# Patient Record
Sex: Male | Born: 1980 | Race: Black or African American | Hispanic: No | Marital: Married | Smoking: Former smoker
Health system: Southern US, Community
[De-identification: ages and names within clinical notes are randomized; demographics above are authoritative.]

## PROBLEM LIST (undated history)

## (undated) DIAGNOSIS — I10 Essential (primary) hypertension: Secondary | ICD-10-CM

## (undated) DIAGNOSIS — E119 Type 2 diabetes mellitus without complications: Secondary | ICD-10-CM

## (undated) HISTORY — DX: Type 2 diabetes mellitus without complications: E11.9

## (undated) HISTORY — PX: ANKLE SURGERY: SHX546

---

## 2002-09-07 ENCOUNTER — Emergency Department (HOSPITAL_COMMUNITY): Admission: EM | Admit: 2002-09-07 | Discharge: 2002-09-08 | Payer: Self-pay | Admitting: Emergency Medicine

## 2004-05-16 ENCOUNTER — Emergency Department (HOSPITAL_COMMUNITY): Admission: EM | Admit: 2004-05-16 | Discharge: 2004-05-16 | Payer: Self-pay | Admitting: Family Medicine

## 2004-10-31 ENCOUNTER — Emergency Department (HOSPITAL_COMMUNITY): Admission: EM | Admit: 2004-10-31 | Discharge: 2004-10-31 | Payer: Self-pay | Admitting: Family Medicine

## 2005-08-08 ENCOUNTER — Ambulatory Visit (HOSPITAL_COMMUNITY): Admission: RE | Admit: 2005-08-08 | Discharge: 2005-08-08 | Payer: Self-pay | Admitting: Family Medicine

## 2005-08-08 ENCOUNTER — Emergency Department (HOSPITAL_COMMUNITY): Admission: EM | Admit: 2005-08-08 | Discharge: 2005-08-08 | Payer: Self-pay | Admitting: Family Medicine

## 2005-08-10 ENCOUNTER — Encounter: Admission: RE | Admit: 2005-08-10 | Discharge: 2005-08-10 | Payer: Self-pay | Admitting: Orthopedic Surgery

## 2005-08-19 ENCOUNTER — Ambulatory Visit (HOSPITAL_BASED_OUTPATIENT_CLINIC_OR_DEPARTMENT_OTHER): Admission: RE | Admit: 2005-08-19 | Discharge: 2005-08-19 | Payer: Self-pay | Admitting: Orthopedic Surgery

## 2006-03-22 ENCOUNTER — Emergency Department (HOSPITAL_COMMUNITY): Admission: EM | Admit: 2006-03-22 | Discharge: 2006-03-22 | Payer: Self-pay | Admitting: Family Medicine

## 2009-05-22 ENCOUNTER — Emergency Department (HOSPITAL_COMMUNITY): Admission: EM | Admit: 2009-05-22 | Discharge: 2009-05-22 | Payer: Self-pay | Admitting: Emergency Medicine

## 2009-05-26 ENCOUNTER — Emergency Department (HOSPITAL_COMMUNITY): Admission: EM | Admit: 2009-05-26 | Discharge: 2009-05-26 | Payer: Self-pay | Admitting: Emergency Medicine

## 2010-02-17 ENCOUNTER — Emergency Department (HOSPITAL_COMMUNITY): Admission: EM | Admit: 2010-02-17 | Discharge: 2010-02-17 | Payer: Self-pay | Admitting: Family Medicine

## 2010-03-24 ENCOUNTER — Ambulatory Visit: Payer: Self-pay | Admitting: Nurse Practitioner

## 2010-03-24 DIAGNOSIS — I10 Essential (primary) hypertension: Secondary | ICD-10-CM

## 2010-03-24 DIAGNOSIS — F17200 Nicotine dependence, unspecified, uncomplicated: Secondary | ICD-10-CM

## 2010-03-24 LAB — CONVERTED CEMR LAB
AST: 14 units/L (ref 0–37)
Alkaline Phosphatase: 46 units/L (ref 39–117)
Basophils Absolute: 0 10*3/uL (ref 0.0–0.1)
Basophils Relative: 0 % (ref 0–1)
Eosinophils Relative: 4 % (ref 0–5)
Glucose, Bld: 93 mg/dL (ref 70–99)
Lymphocytes Relative: 46 % (ref 12–46)
MCHC: 33.5 g/dL (ref 30.0–36.0)
Monocytes Absolute: 0.3 10*3/uL (ref 0.1–1.0)
Neutro Abs: 2 10*3/uL (ref 1.7–7.7)
Platelets: 182 10*3/uL (ref 150–400)
RDW: 12.7 % (ref 11.5–15.5)
Rapid HIV Screen: NEGATIVE
Sodium: 139 meq/L (ref 135–145)
Total Bilirubin: 0.5 mg/dL (ref 0.3–1.2)
Total Protein: 6.9 g/dL (ref 6.0–8.3)

## 2010-03-25 ENCOUNTER — Encounter (INDEPENDENT_AMBULATORY_CARE_PROVIDER_SITE_OTHER): Payer: Self-pay | Admitting: Nurse Practitioner

## 2010-03-26 ENCOUNTER — Ambulatory Visit: Payer: Self-pay | Admitting: Nurse Practitioner

## 2010-06-08 NOTE — Assessment & Plan Note (Signed)
Summary: NEW - Establish care   Vital Signs:  Patient profile:   30 year old male Height:      69 inches Weight:      200.7 pounds BMI:     29.75 Temp:     98.7 degrees F oral Pulse rate:   76 / minute Pulse rhythm:   regular Resp:     18 per minute BP sitting:   104 / 82  (left arm) Cuff size:   regular  Vitals Entered By: Armenia Shannon (March 24, 2010 1:58 PM)  Nutrition Counseling: Patient's BMI is greater than 25 and therefore counseled on weight management options. CC: establish with primary and med refill...., Hypertension Management Is Patient Diabetic? No Pain Assessment Patient in pain? no       Does patient need assistance? Functional Status Self care Ambulation Normal   CC:  establish with primary and med refill.... and Hypertension Management.  History of Present Illness:  Pt into the office to establish care. Pt was previously seeing another PCP here in [place] but lost his insurance and was not able to afford to return to that office.  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 Pt is still searching to get a job in his fleld. His PPD is expired and would like to get today.  No acute problems today  Hypertension History:      He denies headache, chest pain, and palpitations.  He notes no problems with any antihypertensive medication side effects.  Pt has completed his supply of medication this morning.        Positive major cardiovascular risk factors include hypertension and current tobacco user.  Negative major cardiovascular risk factors include male age less than 66 years old.     Habits & Providers  Alcohol-Tobacco-Diet     Alcohol drinks/day: 0     Tobacco Status: current     Tobacco Counseling: to quit use of tobacco products     Cigarette Packs/Day: <0.25     Year Started: age 38  Exercise-Depression-Behavior     Does Patient Exercise: no     Drug Use: never  Current Medications  (verified): 1)  Metoprolol Tartrate 25 Mg Tabs (Metoprolol Tartrate) .... One Tab By Mouth Twice Daily  Allergies (verified): No Known Drug Allergies  Family History: mother - htn father - htn maternal aunt - breast cancer  Social History: 1 child single tobacco - cigarettes 3 per day Drug - ETOH - Smoking Status:  current Packs/Day:  <0.25 Drug Use:  never Does Patient Exercise:  no  Review of Systems General:  Denies fever. CV:  Denies chest pain or discomfort. Resp:  Denies cough. GI:  Denies abdominal pain, nausea, and vomiting.  Physical Exam  General:  alert.   Head:  normocephalic.   Eyes:  pupils round.   Lungs:  normal breath sounds.   Heart:  normal rate and regular rhythm.   Abdomen:  normal bowel sounds.   Msk:  up to the exam table Neurologic:  alert & oriented X3.   Skin:  color normal.   Psych:  Oriented X3.     Impression & Recommendations:  Problem # 1:  HYPERTENSION, BENIGN ESSENTIAL (ICD-401.1) BP is stable today continue current meds - will refill DASH diet reviewed His updated medication list for this problem includes:    Metoprolol Tartrate 25 Mg Tabs (Metoprolol tartrate) ..... One tablet by mouth two times a day for  blood pressure  Orders: T-Comprehensive Metabolic Panel [phone]) T-CBC w/Diff (26948-54627) T-TSH (03500-93818) Rapid HIV  (29937)  Problem # 2:  TOBACCO ABUSE (ICD-305.1) advised cessation  Problem # 3:  NEED PROPHYLACTIC VACCINATION&INOCULATION FLU (ICD-V04.81) given today in office  Problem # 4:  Preventive Health Care (ICD-V70.0) will give ppd today  pt to return in 48-72 hours for reading  Complete Medication List: 1)  Metoprolol Tartrate 25 Mg Tabs (Metoprolol tartrate) .... One tablet by mouth two times a day for blood pressure  Other Orders: Flu Vaccine 51yrs + (16967) TB Skin Test (89381) Admin 1st Vaccine (01751) Admin of Any Addtl Vaccine (02585)  Hypertension Assessment/Plan:      The  patient's hypertensive risk group is category B: At least one risk factor (excluding diabetes) with no target organ damage.  Today's blood pressure is 104/82.  His blood pressure goal is < 140/90.  Patient Instructions: 1)  You have been given the flu vaccine today. 2)  PPD - Get an appointment on Friday afternoon for reading 3)  Blood pressure - take your medications two times a day as ordered.  Blood pressure is doing well. 4)  Labs will be checked today and you will be notified of the results. 5)  Follow up every 6 months or sooner if necessary Prescriptions: METOPROLOL TARTRATE 25 MG TABS (METOPROLOL TARTRATE) One tablet by mouth two times a day for blood pressure  #60 x 5   Entered and Authorized by:   Lehman Prom FNP   Signed by:   Lehman Prom FNP on 03/24/2010   Method used:   Print then Give to Patient   RxID:   [id number]    Orders Added: 1)  New Patient Level III [54008] 2)  T-Comprehensive Metabolic Panel [80053-22900] 3)  T-CBC w/Diff [67619-50932] 4)  T-TSH [67124-58099] 5)  Rapid HIV  [92370] 6)  Flu Vaccine 32yrs + [83382] 7)  TB Skin Test [86580] 8)  Admin 1st Vaccine [90471] 9)  Admin of Any Addtl Vaccine [50539]   Immunizations Administered:  Influenza Vaccine # 1:    Vaccine Type: Fluvax 3+    Site: right deltoid    Mfr: GlaxoSmithKline    Dose: 0.5 ml    Route: IM    Given by: Armenia Shannon    Exp. Date: 11/06/2010    Lot #: [id number]    VIS given: 12/01/09 version given March 24, 2010.  PPD Skin Test:    Vaccine Type: PPD    Site: right forearm    Mfr: jhp pharmaceuticals    Dose: 0.1 ml    Route: ID    Given by: Armenia Shannon    Exp. Date: 03/2011    Lot #: [id number]  Flu Vaccine Consent Questions:    Do you have a history of severe allergic reactions to this vaccine? no    Any prior history of allergic reactions to egg and/or gelatin? no    Do you have a sensitivity to the preservative Thimersol? no    Do you have a past  history of Guillan-Barre Syndrome? no    Do you currently have an acute febrile illness? no    Have you ever had a severe reaction to latex? no    Vaccine information given and explained to patient? yes   Immunizations Administered:  Influenza Vaccine # 1:    Vaccine Type: Fluvax 3+    Site: right deltoid    Mfr: GlaxoSmithKline    Dose: 0.5 ml  Route: IM    Given by: Armenia Shannon    Exp. Date: 11/06/2010    Lot #: [id number]    VIS given: 12/01/09 version given March 24, 2010.  PPD Skin Test:    Vaccine Type: PPD    Site: right forearm    Mfr: jhp pharmaceuticals    Dose: 0.1 ml    Route: ID    Given by: Armenia Shannon    Exp. Date: 03/2011    Lot #: [id number]    Laboratory Results    Other Tests  Rapid HIV: negative

## 2010-06-08 NOTE — Letter (Signed)
Summary: *HSN Results Follow up  [facility]  [address] [place], [place] [address]   Phone: [phone]  Fax: [phone]      03/25/2010   Jason Marsh [address] [place], [place]  [address]   Dear  Jason Marsh,                            ____S.Drinkard,FNP   ____D. Gore,FNP       ____B. McPherson,MD   ____V. Rankins,MD    ____E. Mulberry,MD    _X___N. Daphine Deutscher, FNP  ____D. Reche Dixon, MD    ____K. Philipp Deputy, MD    ____Other     This letter is to inform you that your recent test(s):  _______Pap Smear    __X____Lab Test     _______X-ray    ___X____ is within acceptable limits  _______ requires a medication change  _______ requires a follow-up lab visit  _______ requires a follow-up visit with your provider   Comments: Labs done during recent office visit are normal.       _________________________________________________________ If you have any questions, please contact our office [phone].                    Sincerely,    Jason Prom FNP [facility]

## 2010-09-24 NOTE — Op Note (Signed)
NAMEESTIL, VALLEE              ACCOUNT NO.:  [id number]   MEDICAL RECORD NO.:  [id number]          PATIENT TYPE:  AMB   LOCATION:  [facility]                          FACILITY:  [facility]   PHYSICIAN:  Doralee Albino. Carola Frost, M.D. DATE OF BIRTH:  10-28-1980   DATE OF PROCEDURE:  08/19/2005  DATE OF DISCHARGE:                                 OPERATIVE REPORT   PREOPERATIVE DIAGNOSIS:  1.  Left tibia pilon fracture.  2.  Left lateral osteochondral fracture.   POSTOPERATIVE DIAGNOSIS:  1.  Left tibia pilon fracture.  2.  Left lateral osteochondral fracture.   PROCEDURES:  1.  Open reduction and internal fixation tibial pilon, tibia only.  2.  Open treatment of talus osteochondral fracture with micro-fracture      involving a segment both on the talar dome and talar head.   SURGEON:  Doralee Albino. Carola Frost, M.D.   ASSISTANT:  Aura Fey. Ireton, P.A.-C.   ANESTHESIA:  General supplemented with a popliteal block.   COMPLICATIONS:  None.   TOURNIQUET TIME:  None.   ESTIMATED BLOOD LOSS:  15 mL.   DISPOSITION:  To PACU.   CONDITION:  Stable.   INDICATIONS FOR PROCEDURE:  Wrangler Penning is a 30 year old male who  sustained a left tibial pilon fracture as well as an osteochondral fracture  of the talus after falling on some stairs.  He was initially seen and  evaluated by Dr. Madelon Lips who recommended further evaluation for possible  surgical fixation by myself.  We discussed the risks and benefits of the  procedure to repair this intra-articular fracture with some displacement  distraction at the joint as well as the free osteochondral segment. These  risks included nerve injury, vessel injury, arthritis, need for further  surgery, and DVT, among others.  After a full discussion, the patient wished  to proceed.   DESCRIPTION OF PROCEDURE:  Mr. Collymore was administered preoperative  popliteal block and taken to operating room where general anesthesia was  induced.  His left lower extremity was  prepped and draped in the usual  sterile fashion.  A standard anterolateral approach was then made to the  ankle. The superficial peroneal nerve was identified and retracted  anteriorly.  The periosteum was left intact.  The joint capsule was incised  and hematoma evacuated.  The joint was inspected and the patient was found  to have an impacted talar neck head junction osteochondral fracture.  This  had mushroomed to the extent that it did obstruct the lateral gutter in  extreme extension which was physiologically possible for him.  Also, there  were several loose chondral flap fragments. Consequently, these were  debrided with a rongeur as well as a 15 blade back to a smooth stable edge  and then a 0.45 K-wire used to make several punctate holes through  subchondral bone.  The patient was given a muscle relaxant and we were able  to distract the ankle joint sufficiently to allow for inspection of his  talar dome in the area of the osteochondral fracture. His talar dome lesion  was quite significant and involved greater  than 7 mm x 6 mm lesion of his  dome. The cartilage segment had rotated to 180 degrees and was upside down.  This segment had to be debrided and then the 15 blade used again to take the  surface back to a stable edge.  The ankle was then flexed so that the 0.45  wire could again be used to make several punctate holes in the talar dome.  The joint was irrigated thoroughly of any debris from this and then the  tibial surface inspected.  The tibial surface also had a so-called kissing  cartilaginous lesion. We did not see any hanging cartilage segments from  this and were unclear as to their whereabouts.  They may have been irrigated  out of the joint during the initial debridement.  The fracture was somewhat  distracted at the level of the joint.  We attempted to extend our incision  proximally over the anterolateral aspect of the fracture but we were unable  to palpate the  fracture line in the anticipated area.  We did make a cut in  the periosteum which had been left entirely intact.  We still did not  identify the fracture line. Consequently, rather then stripping more  periosteum, we chose to make a small medial window to visualize the fracture  as it exited into the joint.  This was performed medial to the anterior  tibial tendon.  The saphenous vein was identified and retracted anteriorly.  The dissection was carried down on the joint capsule which was incised  longitudinally and then the fracture site visualized.  It was distracted  with the Freer and curets and lavage used to remove all the hematoma. We did  not visualize the segments of cartilage missing on the joint surface.  After  this had been thoroughly cleaned, we were able to obtain an anatomic  reduction and hold it using a large tenaculum placing the tenaculum on the  posterior aspect of the tibia through a small posterolateral stab incision.  We then used the 3/5 drill to over drill the pilon segment at the  anterolateral corner and placed a 45 mm 3.5 cancellus Synthes screw with  washer achieving outstanding compression which could be visualized  radiographically, palpated, and also visualized directly with distraction of  the joint.  We then placed an additional screw medially, also using standard  lag technique and, again, were able to achieve excellent compression.  No  further hardware was deemed necessary. The wounds were all then copiously  irrigated and closed in standard layer fashion with 0 Vicryl for the joint  layer, 2-0 for the subcu, and staples for the skin.  The patient was placed  into a sterile gently compressive dressing and taken to the PACU in stable  condition.   PROGNOSIS:  Mr. Pote has sustained a severe cartilage injury to his ankle  in addition to his pilon fracture.  We were able to restore stability to the ankle and to debride the loose segments of cartilage  which would have  contributed to arthritic symptoms, microfracture pseudocyst with coverage of  this area by scar cartilage, but certainly is not as resistant to  degenerative changes and symptoms as his native cartilage and consequently  the development of post traumatic arthritis would be expected to some  degree.  We are hopeful this will be asymptomatic, however.  He can take a  baby aspirin for DVT prophylaxis and we will plan to see him back in the  clinic for follow up in about ten days.  He will remain non-weight bearing  for approximately six weeks with graduated weight bearing thereafter.  Once  his wound has healed, we will allow full range of motion.      Doralee Albino. Carola Frost, M.D.  Electronically Signed     MHH/MEDQ  D:  08/19/2005  T:  08/19/2005  Job:  [id number]

## 2011-01-10 ENCOUNTER — Inpatient Hospital Stay (INDEPENDENT_AMBULATORY_CARE_PROVIDER_SITE_OTHER)
Admission: RE | Admit: 2011-01-10 | Discharge: 2011-01-10 | Disposition: A | Discharge status: Home / Self Care | Payer: Self-pay | Source: Ambulatory Visit | Attending: Family Medicine | Admitting: Family Medicine

## 2011-01-10 DIAGNOSIS — J029 Acute pharyngitis, unspecified: Secondary | ICD-10-CM

## 2011-01-10 DIAGNOSIS — J040 Acute laryngitis: Secondary | ICD-10-CM

## 2011-01-12 ENCOUNTER — Inpatient Hospital Stay (INDEPENDENT_AMBULATORY_CARE_PROVIDER_SITE_OTHER)
Admission: RE | Admit: 2011-01-12 | Discharge: 2011-01-12 | Disposition: A | Discharge status: Home / Self Care | Payer: Self-pay | Source: Ambulatory Visit | Attending: Emergency Medicine | Admitting: Emergency Medicine

## 2011-01-12 DIAGNOSIS — J029 Acute pharyngitis, unspecified: Secondary | ICD-10-CM

## 2011-01-12 DIAGNOSIS — R51 Headache: Secondary | ICD-10-CM

## 2011-01-13 LAB — STREP A DNA PROBE: Group A Strep Probe: NEGATIVE

## 2011-01-19 ENCOUNTER — Emergency Department (HOSPITAL_COMMUNITY): Payer: Self-pay

## 2011-01-19 ENCOUNTER — Inpatient Hospital Stay (HOSPITAL_COMMUNITY)
Admission: EM | Admit: 2011-01-19 | Discharge: 2011-01-24 | DRG: 603 | Disposition: A | Discharge status: Home / Self Care | Payer: Self-pay | Attending: Internal Medicine | Admitting: Internal Medicine

## 2011-01-19 DIAGNOSIS — L02229 Furuncle of trunk, unspecified: Secondary | ICD-10-CM | POA: Diagnosis present

## 2011-01-19 DIAGNOSIS — L02239 Carbuncle of trunk, unspecified: Secondary | ICD-10-CM | POA: Diagnosis present

## 2011-01-19 DIAGNOSIS — E119 Type 2 diabetes mellitus without complications: Secondary | ICD-10-CM | POA: Diagnosis present

## 2011-01-19 DIAGNOSIS — A4902 Methicillin resistant Staphylococcus aureus infection, unspecified site: Secondary | ICD-10-CM | POA: Diagnosis present

## 2011-01-19 DIAGNOSIS — I10 Essential (primary) hypertension: Secondary | ICD-10-CM | POA: Diagnosis present

## 2011-01-19 DIAGNOSIS — L02219 Cutaneous abscess of trunk, unspecified: Principal | ICD-10-CM | POA: Diagnosis present

## 2011-01-19 DIAGNOSIS — F17200 Nicotine dependence, unspecified, uncomplicated: Secondary | ICD-10-CM | POA: Diagnosis present

## 2011-01-20 LAB — POCT I-STAT, CHEM 8
Calcium, Ion: 1.12 mmol/L (ref 1.12–1.32)
Creatinine, Ser: 1.3 mg/dL (ref 0.50–1.35)
Sodium: 136 mEq/L (ref 135–145)

## 2011-01-20 LAB — CBC
HCT: 41.9 % (ref 39.0–52.0)
MCH: 31.8 pg (ref 26.0–34.0)
MCV: 89.9 fL (ref 78.0–100.0)
RDW: 12 % (ref 11.5–15.5)

## 2011-01-20 LAB — DIFFERENTIAL
Basophils Relative: 0 % (ref 0–1)
Eosinophils Absolute: 0.1 10*3/uL (ref 0.0–0.7)
Eosinophils Relative: 1 % (ref 0–5)
Lymphs Abs: 2.1 10*3/uL (ref 0.7–4.0)
Monocytes Absolute: 1.2 10*3/uL — ABNORMAL HIGH (ref 0.1–1.0)
Monocytes Relative: 12 % (ref 3–12)
Neutrophils Relative %: 66 % (ref 43–77)

## 2011-01-20 LAB — URINE CULTURE
Colony Count: NO GROWTH
Culture: NO GROWTH

## 2011-01-20 LAB — BASIC METABOLIC PANEL
BUN: 15 mg/dL (ref 6–23)
Chloride: 99 mEq/L (ref 96–112)
GFR calc Af Amer: >60 mL/min (ref >60)
GFR calc non Af Amer: >60 mL/min (ref >60)
Potassium: 4.1 mEq/L (ref 3.5–5.1)
Sodium: 135 mEq/L (ref 135–145)

## 2011-01-20 LAB — RAPID URINE DRUG SCREEN, HOSP PERFORMED
Barbiturates: NOT DETECTED
Cocaine: NOT DETECTED

## 2011-01-20 LAB — URINALYSIS, ROUTINE W REFLEX MICROSCOPIC
Bilirubin Urine: NEGATIVE
Glucose, UA: NEGATIVE mg/dL
Hgb urine dipstick: NEGATIVE
Ketones, ur: NEGATIVE mg/dL
Leukocytes, UA: NEGATIVE
pH: 5.5 (ref 5.0–8.0)

## 2011-01-20 LAB — RPR: RPR Ser Ql: NONREACTIVE

## 2011-01-20 MED ORDER — IOHEXOL 300 MG/ML  SOLN
80.0000 mL | Freq: Once | INTRAMUSCULAR | Status: AC | PRN
  Administered 2011-01-20: 80 mL via INTRAVENOUS

## 2011-01-21 LAB — COMPREHENSIVE METABOLIC PANEL
ALT: 25 U/L (ref 0–53)
AST: 14 U/L (ref 0–37)
Albumin: 2.8 g/dL — ABNORMAL LOW (ref 3.5–5.2)
Alkaline Phosphatase: 55 U/L (ref 39–117)
CO2: 27 mEq/L (ref 19–32)
Chloride: 103 mEq/L (ref 96–112)
Creatinine, Ser: 1.25 mg/dL (ref 0.50–1.35)
GFR calc non Af Amer: >60 mL/min (ref >60)
Potassium: 4.1 mEq/L (ref 3.5–5.1)
Total Bilirubin: 0.5 mg/dL (ref 0.3–1.2)

## 2011-01-21 LAB — DIFFERENTIAL
Basophils Absolute: 0 10*3/uL (ref 0.0–0.1)
Eosinophils Absolute: 0.1 10*3/uL (ref 0.0–0.7)
Eosinophils Relative: 1 % (ref 0–5)
Neutrophils Relative %: 66 % (ref 43–77)

## 2011-01-21 LAB — CBC
Hemoglobin: 12.4 g/dL — ABNORMAL LOW (ref 13.0–17.0)
MCV: 91.3 fL (ref 78.0–100.0)
Platelets: 303 10*3/uL (ref 150–400)
RBC: 4.01 MIL/uL — ABNORMAL LOW (ref 4.22–5.81)
WBC: 7 10*3/uL (ref 4.0–10.5)

## 2011-01-21 LAB — HEMOGLOBIN A1C: Hgb A1c MFr Bld: 6.2 % — ABNORMAL HIGH (ref <5.7)

## 2011-01-21 LAB — HIV ANTIBODY (ROUTINE TESTING W REFLEX): HIV: NONREACTIVE

## 2011-01-22 LAB — BASIC METABOLIC PANEL
BUN: 8 mg/dL (ref 6–23)
Calcium: 8.9 mg/dL (ref 8.4–10.5)
Creatinine, Ser: 1.15 mg/dL (ref 0.50–1.35)
GFR calc Af Amer: >60 mL/min (ref >60)

## 2011-01-22 LAB — VANCOMYCIN, TROUGH: Vancomycin Tr: 44.1 ug/mL (ref 10.0–20.0)

## 2011-01-22 LAB — CBC
HCT: 37 % — ABNORMAL LOW (ref 39.0–52.0)
MCH: 31.4 pg (ref 26.0–34.0)
MCHC: 34.3 g/dL (ref 30.0–36.0)
MCV: 91.6 fL (ref 78.0–100.0)
Platelets: 293 10*3/uL (ref 150–400)
RDW: 12.3 % (ref 11.5–15.5)

## 2011-01-23 LAB — CULTURE, ROUTINE-ABSCESS

## 2011-01-23 LAB — CBC
Hemoglobin: 13 g/dL (ref 13.0–17.0)
MCH: 31.6 pg (ref 26.0–34.0)
MCHC: 34.8 g/dL (ref 30.0–36.0)
RDW: 12.3 % (ref 11.5–15.5)

## 2011-01-23 LAB — VANCOMYCIN, TROUGH: Vancomycin Tr: 7.9 ug/mL — ABNORMAL LOW (ref 10.0–20.0)

## 2011-01-24 LAB — MRSA PCR SCREENING: MRSA by PCR: POSITIVE — AB

## 2011-02-04 ENCOUNTER — Encounter (HOSPITAL_BASED_OUTPATIENT_CLINIC_OR_DEPARTMENT_OTHER): Payer: Self-pay | Attending: Internal Medicine

## 2011-02-04 DIAGNOSIS — I10 Essential (primary) hypertension: Secondary | ICD-10-CM | POA: Insufficient documentation

## 2011-02-04 DIAGNOSIS — E119 Type 2 diabetes mellitus without complications: Secondary | ICD-10-CM | POA: Insufficient documentation

## 2011-02-04 DIAGNOSIS — L02419 Cutaneous abscess of limb, unspecified: Secondary | ICD-10-CM | POA: Insufficient documentation

## 2011-02-04 DIAGNOSIS — L03119 Cellulitis of unspecified part of limb: Secondary | ICD-10-CM | POA: Insufficient documentation

## 2011-02-04 DIAGNOSIS — A4902 Methicillin resistant Staphylococcus aureus infection, unspecified site: Secondary | ICD-10-CM | POA: Insufficient documentation

## 2011-02-04 DIAGNOSIS — L98499 Non-pressure chronic ulcer of skin of other sites with unspecified severity: Secondary | ICD-10-CM | POA: Insufficient documentation

## 2011-02-04 DIAGNOSIS — L97109 Non-pressure chronic ulcer of unspecified thigh with unspecified severity: Secondary | ICD-10-CM | POA: Insufficient documentation

## 2011-02-06 NOTE — H&P (Signed)
Jason Marsh, Jason Marsh              ACCOUNT NO.:  [id number]  MEDICAL RECORD NO.:  [id number]  LOCATION:  [facility]                         FACILITY:  [facility]  PHYSICIAN:  Eduard Clos, MDDATE OF BIRTH:  09-29-1980  DATE OF ADMISSION:  01/19/2011 DATE OF DISCHARGE:                             HISTORY & PHYSICAL   PRIMARY CARE PHYSICIAN:  Unassigned.  CHIEF COMPLAINT:  Swelling in the left groin area.  HISTORY OF PRESENT ILLNESS:  A 30 year old male with history of hypertension has been experiencing some bumps and boils on his left groin area over the last 1 week.  The patient had gone to the ER for the fever, chills, and some sore throat, and was given amoxicillin.  At that time, those boils were very small, but over the last 1 week it increased in size and 3 days ago it ulcerated and had discharge.  Presently, the patient has at least 3 ulcers in the left groin area, one at least measuring 2 cm, another two measuring 1 cm and 0.5 cm each with some fleshy base.  The patient at this time says he has no fever, chills, though last week he did have for which he was taking amoxicillin and presently has no sore throat.  Denies chest pain, shortness of breath. Denies any nausea, vomiting, abdominal pain, dysuria, discharge, diarrhea.  Denies any headache, visual symptoms, or any arthralgia.  The patient denies any sexual partner except for his regular girlfriend. Denies any discharge from the penis.  PAST MEDICAL HISTORY:  Hypertension.  PAST SURGICAL HISTORY:  Left tubal surgery.  MEDICATIONS ON ADMISSION:  The patient takes metoprolol 25 p.o. b.i.d. which he gets refilled from the [facility].  ALLERGIES:  No known drug allergies.  SOCIAL HISTORY:  The patient smokes cigarettes.  Denies any alcohol or drug abuse.  FAMILY HISTORY:  Positive for hypertension in his parents.  REVIEW OF SYSTEMS:  As per history of present illness, nothing else significant.  PHYSICAL  EXAMINATION:  GENERAL:  The patient examined at bedside, not in acute distress. VITAL SIGNS:  Blood pressure 130/80, pulse 80 per minute, temperature 99.9, respiration 18 per minute, O2 sat 97%. HEENT:  Anicteric.  No pallor.  No discharge from ears, eyes, nose, or mouth. CHEST:  Bilateral air entry present.  No rhonchi, no crepitation. HEART:  S1 and S2 heard. ABDOMEN:  Soft, nontender.  Bowel sounds heard. CNS:  Alert, awake, and oriented to time, place, and person.  Moves upper and lower extremities 5/5. EXTREMITIES:  There are at least 3 ulcers on his left groin area, one is measuring around 2 cm with fleshy base.  No active discharge at this time.  Indurated around it.  Another one 1 cm, and another one 0.5 cm next to the base of his scrotum.  The scrotum and his penis does not have any discharge or any swelling or erythema.  Mild tenderness on palpation of all of these ulcers.  LABORATORY DATA:  CT pelvis without contrast shows very superficial soft tissue wound noted at the proximal medial left thigh with mild associated focal skin thickening and skin defect, stranding noted within the underlying fat remaining to the vasculature  and fascia.  No evidence of muscular involvement.  No evidence of abscess.  CBC; WBC is 10, hemoglobin is 16, hematocrit is 47, platelets 340.  Basic metabolic panel; sodium 133, potassium 4.1, chloride 101, carbon dioxide 26, glucose 115, BUN 70, creatinine 1.6, calcium 9.6.  UA is negative for nitrites and leukocytes.  ASSESSMENT: 1. Cellulitis of the left groin/thigh area. 2. History of hypertension. 3. History of tobacco abuse.  PLAN: 1. At this time, admit the patient to medical floor. 2. For his cellulitis at this time we will keep the patient on     vancomycin and Zosyn, get wound cultures and sensitivity, and     closely observe the progression if it is getting better, then     probably send home on oral antibiotic based on the culture  and     sensitivity.     Eduard Clos, MD     ANK/MEDQ  D:  01/20/2011  T:  01/20/2011  Job:  [id number]  Electronically Signed by Midge Minium MD on 02/06/2011 11:58:14 AM

## 2011-02-06 NOTE — H&P (Signed)
[name], [name]              ACCOUNT NO.:  [id number]  MEDICAL RECORD NO.:  [id number]  LOCATION:  5509                         FACILITY:  [facility]  PHYSICIAN:  Isidor Holts, M.D.  DATE OF BIRTH:  1980/10/11  DATE OF ADMISSION:  01/19/2011 DATE OF DISCHARGE:  01/24/2011                             HISTORY & PHYSICAL   PRIMARY CARE PHYSICIAN:  [facility].  DISCHARGE DIAGNOSES: 1. Infected left thigh ulceration/wounds, due to methicillin-resistant     Staphylococcus aureus. 2. New onset diabetes - Diet controlled. 3. Hypertension. 4. Tobacco abuse.  HISTORY:     [name] is a very pleasant 30 year old male with a     history of hypertension who had been experiencing some boils on his     left groin over the past week prior to admission.  They had gone to     the emergency department for fever, chills, and sore throat and was     given amoxicillin.  At that time that boils were very small but     over the week prior to admission they increased in size.  Three     days prior to admission, one of them ulcerated and had discharge.     In the emergency department, he was diagnosed with cellulitis of     the left groin and thigh area.  [facility] was called for     admission.    BRIEF HOSPITAL COURSE:  1. Infected Left groin/thigh wounds.      The patient was admitted to a regular floor.  He was     started on vancomycin and Zosyn IV.  He was seen by Wound Care for     his ulcerations on his left thigh.  He had hydrotherapy during his     hospitalization.  Wounds were very painful.  Consequently the     patient was maintained on IV morphine and Vicodin as needed.  After     5 days treatment with IV vancomycin and Zosyn, the patient is being     changed to p.o. doxycycline.  He will be discharged to home in     stable condition.  He will have followup with the [facility]     and in October he will have followup with primary care physician at  [facility].  The patient will have ongoing wet to dry dressing     changes at home for his wounds and be on 10 days of doxycycline.  2. New onset diabetes.  The patient was noted to have a hemoglobin A1c     of 6.2, although his blood sugar levels in the hospital were not     terribly elevated.  They ranged from 100-115.  The patient was     advised that he has early diabetes.  He was given educational     material about a diabetic diet and the importance of exercise.  He     was counseled against drinking regular sodas and alcohol and     encouraged to start an exercise regimen.  The connection between     serious infections and diabetes was explained to the patient.  He  appears to understand and commits to make these lifestyle changes.     He will have followup with his primary care physician for new onset     diabetes.  Hopefully this will be able to be controlled with diet     and lifestyle changes and he will not need medications.  3. Hypertension.  The patient's blood pressure was stable during this hospitalization.  Previously he was on metoprolol, however, given     his new onset diabetes we will change him to lisinopril 10 mg daily     at the time of discharge and ask for him to follow up at     [facility] for his hypertension.  PHYSICAL EXAMINATION AT TIME OF DISCHARGE:  GENERAL:  The patient is alert and oriented, sitting up on the side of the bed.  He appears well. VITAL SIGNS:  Temperature 98.6, pulse 90, respirations 18, blood pressure 125/75.  O2 sats 98% on room air. HEENT:  Head is atraumatic, normocephalic.  Eyes are anicteric.  Pupils are equal and round.  Nose shows no nasal discharge or exterior lesions. Mouth has moist mucous membranes with good dentition. NECK:  Supple with midline trachea.  No JVD.  No lymphadenopathy. CHEST:  Demonstrates no accessory muscle use.  He has no wheezes or crackles to my exam. HEART:  Has a regular rate and rhythm without  obvious murmurs, rubs, or gallops. ABDOMEN:  Soft, nontender, nondistended with good bowel sounds. EXTREMITIES:  Show no clubbing, cyanosis, or edema.  However, in his left upper thigh and groin area, he has 3 ulcerations, one of them is approximately 2 cm in diameter and 1 cm deep.  These are still draining pink serous fluid.  There is no odor.  There is no swelling. NEURO:  Cranial nerves II-XII are grossly intact.  He has no facial asymmetries. PSYCHIATRIC:  The patient is alert and oriented.  Demeanor is pleasant, cooperative.  Grooming is good.  PERTINENT LABS THIS HOSPITALIZATION:  The patient's white count on September 16 is 5.7, hemoglobin 13.0, hematocrit 37.4, platelets 293,000.  His last BMET was checked on September 15 and showed sodium 139, potassium 4.4, chloride 106, bicarb 29, glucose 106, BUN 8, creatinine 1.15, total protein 2.8.  LFTs are within normal limits. Hemoglobin A1c was 6.2 with a mean plasma glucose of 131.  The patient had his abscess cultured.  During this hospitalization, it was positive for methicillin-resistant staphylococcus aureus  which is sensitive to Levaquin, gentamicin, rifampin, TMP, vancomycin, and tetracycline.  He had a urine culture that was done on September 13 that showed no growth final.  GC probe was negative.  Chlamydia probe negative.  RPR nonreactive.  RADIOLOGICAL EXAMS:  On January 19, 2011, the patient had an x-ray of his left femur that showed round soft tissue irregularity involving the inner thigh with no osseous abnormality.  On September 13, he had a CT of his pelvis with and without contrast that showed very superficial soft tissue wound noted at the proximal medial left thigh with associated focal skin thickening and corresponding rounded skin defects, stranding noted within the underlying fat, remaining superficial to the vasculature and fascia, no evidence of muscular involvement, no evidence of abscess.  DISCHARGE  MEDICATIONS: 1. Doxycycline 100 mg p.o. b.i.d., take for 10 days. 2. Hydrocodone/acetaminophen 1 tablet by mouth every 6 hours as needed     for pain.  He will receive a prescription for 5 days. 3. Lisinopril 10 mg 1 tablet by mouth  daily. 4. Ibuprofen 200 mg p.o. every 8 hours as needed for pain.  The patient will be discharged with a work note so that he can return to work on January 31, 2011.  DISCHARGE INSTRUCTIONS:  The patient will be discharged to home in stable condition.  Activity level will be as tolerated.  As mentioned he can return to work in 7 days.  Diet is low carb diabetic diet.  Avoid alcohol and regular soft drink.  Wound care:  He will have wet to dry dressing changes as per Physical Therapy.  FOLLOWUP APPOINTMENTS:  He is to see Dr. Allena Katz at [facility] [phone] on February 15, 2011 at 11:00 a.m.  He will also have an appointment at the [facility] Care Clinic next week.     Stephani Police, PA   ______________________________ Isidor Holts, M.D.    MLY/MEDQ  D:  01/24/2011  T:  01/24/2011  Job:  [id number]  cc:   [facility]  Electronically Signed by Algis Downs PA on 01/31/2011 11:57:33 AM Electronically Signed by Isidor Holts M.D. on 02/06/2011 05:39:42 PM

## 2011-02-18 ENCOUNTER — Encounter (HOSPITAL_BASED_OUTPATIENT_CLINIC_OR_DEPARTMENT_OTHER): Payer: Self-pay | Attending: Internal Medicine

## 2011-02-18 DIAGNOSIS — L02419 Cutaneous abscess of limb, unspecified: Secondary | ICD-10-CM | POA: Insufficient documentation

## 2011-02-18 DIAGNOSIS — L97109 Non-pressure chronic ulcer of unspecified thigh with unspecified severity: Secondary | ICD-10-CM | POA: Insufficient documentation

## 2011-02-18 DIAGNOSIS — E119 Type 2 diabetes mellitus without complications: Secondary | ICD-10-CM | POA: Insufficient documentation

## 2011-02-18 DIAGNOSIS — L98499 Non-pressure chronic ulcer of skin of other sites with unspecified severity: Secondary | ICD-10-CM | POA: Insufficient documentation

## 2011-02-18 DIAGNOSIS — I10 Essential (primary) hypertension: Secondary | ICD-10-CM | POA: Insufficient documentation

## 2011-02-18 DIAGNOSIS — A4902 Methicillin resistant Staphylococcus aureus infection, unspecified site: Secondary | ICD-10-CM | POA: Insufficient documentation

## 2011-03-11 ENCOUNTER — Inpatient Hospital Stay (INDEPENDENT_AMBULATORY_CARE_PROVIDER_SITE_OTHER)
Admission: RE | Admit: 2011-03-11 | Discharge: 2011-03-11 | Disposition: A | Discharge status: Home / Self Care | Payer: Self-pay | Source: Ambulatory Visit | Attending: Emergency Medicine | Admitting: Emergency Medicine

## 2011-03-11 DIAGNOSIS — I10 Essential (primary) hypertension: Secondary | ICD-10-CM

## 2011-06-14 ENCOUNTER — Emergency Department (INDEPENDENT_AMBULATORY_CARE_PROVIDER_SITE_OTHER)
Admission: EM | Admit: 2011-06-14 | Discharge: 2011-06-14 | Disposition: A | Discharge status: Home / Self Care | Payer: Self-pay | Source: Home / Self Care | Attending: Emergency Medicine | Admitting: Emergency Medicine

## 2011-06-14 ENCOUNTER — Encounter (HOSPITAL_COMMUNITY): Payer: Self-pay | Admitting: Emergency Medicine

## 2011-06-14 DIAGNOSIS — I10 Essential (primary) hypertension: Secondary | ICD-10-CM

## 2011-06-14 HISTORY — DX: Essential (primary) hypertension: I10

## 2011-06-14 LAB — POCT I-STAT, CHEM 8
BUN: 19 mg/dL (ref 6–23)
Calcium, Ion: 1.16 mmol/L (ref 1.12–1.32)
Chloride: 106 mEq/L (ref 96–112)
Glucose, Bld: 105 mg/dL — ABNORMAL HIGH (ref 70–99)
HCT: 52 % (ref 39.0–52.0)
TCO2: 24 mmol/L (ref 0–100)

## 2011-06-14 MED ORDER — LISINOPRIL 10 MG PO TABS: 10.0000 mg | ORAL_TABLET | Freq: Every day | ORAL | Status: DC

## 2011-06-14 NOTE — ED Notes (Signed)
Patient requesting medication refill.  Last bp med taken yesterday.

## 2011-06-14 NOTE — ED Provider Notes (Signed)
Chief Complaint  Patient presents with  . Medication Refill    History of Present Illness:  The patient comes in today for refill on his blood pressure medicine, lisinopril 10 mg once daily. He's been on this since 2005 and has had no medication side effects. He denies any other symptoms such as headache, blurry vision, dizziness, chest pain, pressure, tightness, shortness of breath, palpitations, syncope, or ankle edema. He has had no strokelike symptoms. He denies any other history of cardiac disease, diabetes, kidney disease, hyperlipidemia, or stroke. He does not have a primary care physician and comes here to get his blood pressure medication filled.  Review of Systems:  Other than noted above, the patient denies any of the following symptoms: Systemic:  No fever, chills, sweats, fatigue, or weight loss or gain. Eye:  No diplopia or blurry vision. Lungs:  No cough, wheezing, or shortness of breath. Heart:  No chest pain, tightness, pressure, palpitations, rapid heartbeat, dizziness, syncope, ankle edema, PND or orthopnea. Neuro:  No headache, vertigo, paresthesias, weakness, ataxia, or slurred speech.  PMFSH:  Past medical history, family history, social history, meds, and allergies were reviewed.  Physical Exam:   Vital signs:  BP 161/94  Pulse 74  Temp(Src) 99.4 F (37.4 C) (Oral)  Resp 18  SpO2 100% General:  Alert, oriented, in no distress. Lungs:  Breath sounds clear and equal bilaterally.  No wheezes, rales or rhonchi. Heart:  Regular rhythm, no gallops, murmers, clicks or rubs. Abdomen:  Soft and flat  Labs:   Results for orders placed during the hospital encounter of 06/14/11  POCT I-STAT, CHEM 8      Component Value Range   Sodium 139  135 - 145 (mEq/L)   Potassium 4.2  3.5 - 5.1 (mEq/L)   Chloride 106  96 - 112 (mEq/L)   BUN 19  6 - 23 (mg/dL)   Creatinine, Ser 1.61  0.50 - 1.35 (mg/dL)   Glucose, Bld 096 (*) 70 - 99 (mg/dL)   Calcium, Ion 0.45  4.09 - 1.32  (mmol/L)   TCO2 24  0 - 100 (mmol/L)   Hemoglobin 17.7 (*) 13.0 - 17.0 (g/dL)   HCT 81.1  91.4 - 78.2 (%)     Radiology:  No results found.  Medications given in UCC:  None  Assessment:   Diagnoses that have been ruled out:  None  Diagnoses that are still under consideration:  None  Final diagnoses:  HYPERTENSION, BENIGN ESSENTIAL    Plan:   1.  The following meds were prescribed:   New Prescriptions   No medications on file   2.  The patient was instructed in symptomatic care and handouts were given. 3.  The patient was told to return if becoming worse in any way, if no better in 3 or 4 days, and given some red flag symptoms that would indicate earlier return.  Follow up:  The patient was told to follow up with a primary care physician as soon as possible.    Roque Lias, MD 06/14/11 [phone]

## 2011-10-04 ENCOUNTER — Emergency Department (INDEPENDENT_AMBULATORY_CARE_PROVIDER_SITE_OTHER)
Admission: EM | Admit: 2011-10-04 | Discharge: 2011-10-04 | Disposition: A | Discharge status: Home / Self Care | Payer: Self-pay | Source: Home / Self Care | Attending: Family Medicine | Admitting: Family Medicine

## 2011-10-04 ENCOUNTER — Encounter (HOSPITAL_COMMUNITY): Payer: Self-pay | Admitting: *Deleted

## 2011-10-04 DIAGNOSIS — I10 Essential (primary) hypertension: Secondary | ICD-10-CM

## 2011-10-04 DIAGNOSIS — Z760 Encounter for issue of repeat prescription: Secondary | ICD-10-CM

## 2011-10-04 MED ORDER — LISINOPRIL 10 MG PO TABS: 10.0000 mg | ORAL_TABLET | Freq: Every day | ORAL | Status: DC

## 2011-10-04 NOTE — Discharge Instructions (Signed)
You need to find a primary care provider to monitor your blood pressure refilled your medications and perform proper age related screening. Call number provided in the list below to find one. Return here as needed.    Go to www.goodrx.com to look up your medications. This will give you a list of where you can find your prescriptions at the most affordable prices.   Call [facility]  [phone]  If you have no primary doctor, here are some resources that may be helpful:  Medicaid-accepting [facility] Providers:   - [facility]- [address]      [phone]      Mon-Fri 9am-7pm, Sat 9am-1pm   - [facility]- [address] [place]      [phone]   - [facility]- [address] [place]      [phone]   [facility]- [address]      [phone]   - Renaye Rakers- [address] [place], [address]      [phone]      Only accepts [place] patients       after they have her name applied to their card   Self Pay (no insurance) in [place]:   - Sickle Cell Patients: Dr Willey Blade, [facility]      [address] [place]      [phone]   - [facility]504 [phone]   - Physician Referral Service- [phone]   - [facility]- [address] [place]      [phone]   [facility]- [address]   - [facility]- see information above      (Speak to [employer] if you do not have insurance)   - [facility]- [address] [place]      [phone]   - [facility] [place]- [address]      [phone]   - [facility]- [address]      [phone]   - Dr Julio Sicks-  [address], [place]      [phone]   - [facility]- [address]      [phone]   - [facility]- [address]      [phone]      Also [address]      [phone]   - [facility]- [address]      [phone]      1st and 3rd Saturday every month, 10am-1pm    Other agencies that provide inexpensive medical care:     [facility]  [phone]    [facility]  [phone]    [facility]  [phone]    [facility]  [phone] [address] [place] [address]    [facility]  [phone]    [facility]  [phone] [facility] [phone]   [address] [place], [place] [address]  Chronic Pain Problems Contact [facility]  [phone] Patients need to be referred by their primary care doctor.  [facility] [place]     [employer]  [facility] Dept. [address] [place]                       [address]      [address] [place] [address]   [phone] (After Hours)  General Information: Finding a doctor when you do not have health insurance can be tricky. Although you are not limited by an insurance plan, you are of course limited by her finances and how much but he can pay out of pocket.  What are your options if you don't have health insurance?   1) Find a Librarian, academic and Pay Out of Pocket Although you won't have to find out who is covered by your insurance plan, it is a good idea to ask around and get recommendations. You will then need to call the office and see if the doctor you have chosen will accept you as a new patient and what types of options they offer for patients who are self-pay. Some doctors offer discounts or will set up payment plans for their patients who do not have insurance, but you will need to ask so you aren't surprised when you get to your appointment.  2) Contact Your Local Health Department Not all health departments have doctors that can see patients for sick visits, but many do, so it is  worth a call to see if yours does. If you don't know where your local health department is, you can check in your phone book. The CDC also has a tool to help you locate your state's health department, and many state websites also have listings of all of their local health departments.  3) Find a Walk-in Clinic If your illness is not likely to be very severe or complicated, you may want to try a walk in clinic. These are popping up all over the country in pharmacies, drugstores, and shopping centers. They're usually staffed by nurse practitioners or physician assistants that have been trained to treat common illnesses and complaints. They're usually fairly quick and inexpensive. However, if you have serious medical issues or chronic medical problems, these are probably not your best option

## 2011-10-04 NOTE — ED Provider Notes (Signed)
History     CSN: [id number]  Arrival date & time 10/04/11  1643   First MD Initiated Contact with Patient 10/04/11 1706      Chief Complaint  Patient presents with  . Medication Refill    (Consider location/radiation/quality/duration/timing/severity/associated sxs/prior treatment) HPI Comments: 31 year old nonsmoker male, with history of hypertension diagnosed in 2005. Here requesting refill of his medication for blood pressure. He states he has been on this medication (lisinopril 10 mg) since he was diagnosed several years ago. Was seen here 2 months ago also requested refills and had a normal creatinine and electrolytes point-of-care test. He denies headache, blurred vision, dizziness, chest pain , leg swelling, paroxysmal nocturnal dyspnea or any other complaints. Reports feeling well despite running out of his medications 3 days ago. His blood pressure is 140/90 here. Has not been able to find a primary care provider as he does not have insurance. Patient's reports he is employed but Brewing technologist offered but he still is too expensive.   Past Medical History  Diagnosis Date  . Hypertension     Past Surgical History  Procedure Date  . Ankle surgery     left ankle    No family history on file.  History  Substance Use Topics  . Smoking status: Never Smoker   . Smokeless tobacco: Not on file  . Alcohol Use: No      Review of Systems  Constitutional: Negative for fever, diaphoresis, appetite change and fatigue.  HENT: Negative for neck pain and tinnitus.   Eyes: Negative for visual disturbance.  Respiratory: Negative for shortness of breath.   Cardiovascular: Negative for chest pain, palpitations and leg swelling.  Gastrointestinal: Negative for abdominal pain.  Neurological: Negative for dizziness, syncope, numbness and headaches.  Psychiatric/Behavioral: The patient is not hyperactive.     Allergies  Review of patient's allergies indicates no known  allergies.  Home Medications   Current Outpatient Rx  Name Route Sig Dispense Refill  . LISINOPRIL 10 MG PO TABS Oral Take 1 tablet (10 mg total) by mouth daily. 30 tablet 2    BP 140/90  Pulse 79  Temp(Src) 98.9 F (37.2 C) (Oral)  Resp 16  SpO2 99%  Physical Exam  Nursing note and vitals reviewed. Constitutional: He is oriented to person, place, and time. He appears well-developed and well-nourished. No distress.  HENT:  Head: Normocephalic and atraumatic.  Eyes: EOM are normal. Pupils are equal, round, and reactive to light.  Neck: Neck supple. No JVD present. No thyromegaly present.  Cardiovascular: Normal rate, regular rhythm, normal heart sounds and intact distal pulses.  Exam reveals no gallop and no friction rub.   No murmur heard. Pulmonary/Chest: Breath sounds normal.  Abdominal: Soft. He exhibits no mass. There is no tenderness.  Neurological: He is alert and oriented to person, place, and time.  Skin: No rash noted.    ED Course  Procedures (including critical care time)  Labs Reviewed - No data to display No results found.   1. Hypertension   2. Encounter for medication refill       MDM  History of high blood pressure. Asymptomatic here. Blood pressure 140/90. Ran out of lisinopril 10 mg daily. Take medications refills for 3 months. [place]. Patient asked to establish care with a primary care provider.        Sharin Grave, MD 10/05/11 1950

## 2011-10-04 NOTE — ED Notes (Signed)
Ran   Out of  His  Lisinopril    Last  Dose  3  Days  Ago       No  Symptoms  No PCP

## 2012-01-24 ENCOUNTER — Encounter (HOSPITAL_COMMUNITY): Payer: Self-pay | Admitting: *Deleted

## 2012-01-24 ENCOUNTER — Emergency Department (INDEPENDENT_AMBULATORY_CARE_PROVIDER_SITE_OTHER)
Admission: EM | Admit: 2012-01-24 | Discharge: 2012-01-24 | Disposition: A | Discharge status: Home / Self Care | Payer: Self-pay | Source: Home / Self Care | Attending: Emergency Medicine | Admitting: Emergency Medicine

## 2012-01-24 DIAGNOSIS — I10 Essential (primary) hypertension: Secondary | ICD-10-CM

## 2012-01-24 DIAGNOSIS — Z760 Encounter for issue of repeat prescription: Secondary | ICD-10-CM

## 2012-01-24 MED ORDER — LISINOPRIL 10 MG PO TABS: 10.0000 mg | ORAL_TABLET | Freq: Every day | ORAL | Status: DC

## 2012-01-24 NOTE — ED Provider Notes (Signed)
History     CSN: [id number]  Arrival date & time 01/24/12  0865   First MD Initiated Contact with Patient 01/24/12 [phone]      Chief Complaint  Patient presents with  . Medication Refill    (Consider location/radiation/quality/duration/timing/severity/associated sxs/prior treatment) HPI Comments: Patient presents to urgent care this morning requesting his blood pressure medicine to be refill as he had run out of it. Denies any symptoms or further concerns. Have not had time to establish [place] over her primary care doctor and does not have insurance at this point. Denies any symptomatology when asked such as chest pains, palpitation, swelling of legs, headaches, numbness or weakness or paresthesias in general. Patient denies any constitutional symptoms as well.  The history is provided by the patient.    Past Medical History  Diagnosis Date  . Hypertension     Past Surgical History  Procedure Date  . Ankle surgery     left ankle    Family History  Problem Relation Age of Onset  . Family history unknown: Yes    History  Substance Use Topics  . Smoking status: Never Smoker   . Smokeless tobacco: Not on file  . Alcohol Use: No      Review of Systems  Constitutional: Negative for chills, diaphoresis, activity change, appetite change and fatigue.  Respiratory: Negative for cough and shortness of breath.   Cardiovascular: Negative for chest pain, palpitations and leg swelling.  Neurological: Negative for dizziness and headaches.    Allergies  Review of patient's allergies indicates no known allergies.  Home Medications   Current Outpatient Rx  Name Route Sig Dispense Refill  . LISINOPRIL 10 MG PO TABS Oral Take 1 tablet (10 mg total) by mouth daily. 30 tablet 2    BP 137/93  Pulse 75  Temp 98.3 F (36.8 C) (Oral)  Resp 16  SpO2 100%  Physical Exam  Nursing note and vitals reviewed. Constitutional: Vital signs are normal. He appears well-developed and  well-nourished.  Non-toxic appearance. He does not have a sickly appearance. He does not appear ill. No distress.  HENT:  Head: Normocephalic.  Eyes: Conjunctivae normal are normal.  Neck: Neck supple. No JVD present.  Cardiovascular: Normal rate.  Exam reveals no gallop and no friction rub.   No murmur heard. Pulmonary/Chest: Effort normal and breath sounds normal. No respiratory distress. He has no decreased breath sounds. He has no wheezes. He has no rhonchi. He has no rales. He exhibits no tenderness.  Abdominal: Soft.  Musculoskeletal: He exhibits no edema and no tenderness.  Neurological: He is alert.  Skin: Skin is warm.    ED Course  Procedures (including critical care time)  Labs Reviewed - No data to display No results found.   1. Encounter for medication refill       MDM  Chronic hypertension. Patient was provided with lisinopril prescription. Noted that this is the third visit where he comes to urgent care to obtain medication refills. I have provided patient with several referral resources for him to establish care with a primary care Dr. Finis Bud to them the importance of working with one provider for blood pressure control and other healthcare maintenance measures. Patient agrees and understands and will proceed to call the provided numbers. To establish care. Patient is asymptomatic during today's encounter.        Jimmie Molly, MD 01/24/12 [phone]

## 2012-01-24 NOTE — ED Notes (Signed)
Pt reports needing htn med refilled has not had since the 13th of September. Does not have pcp/insurance

## 2012-11-15 IMAGING — CR DG FEMUR 2V*L*
4 series · 4 of 4 positions shown · non-contrast
Comparison: None.

CLINICAL DATA: Left leg pain, inner thigh boil.

LEFT FEMUR - 2 VIEW

[t femur with hip  ap left]
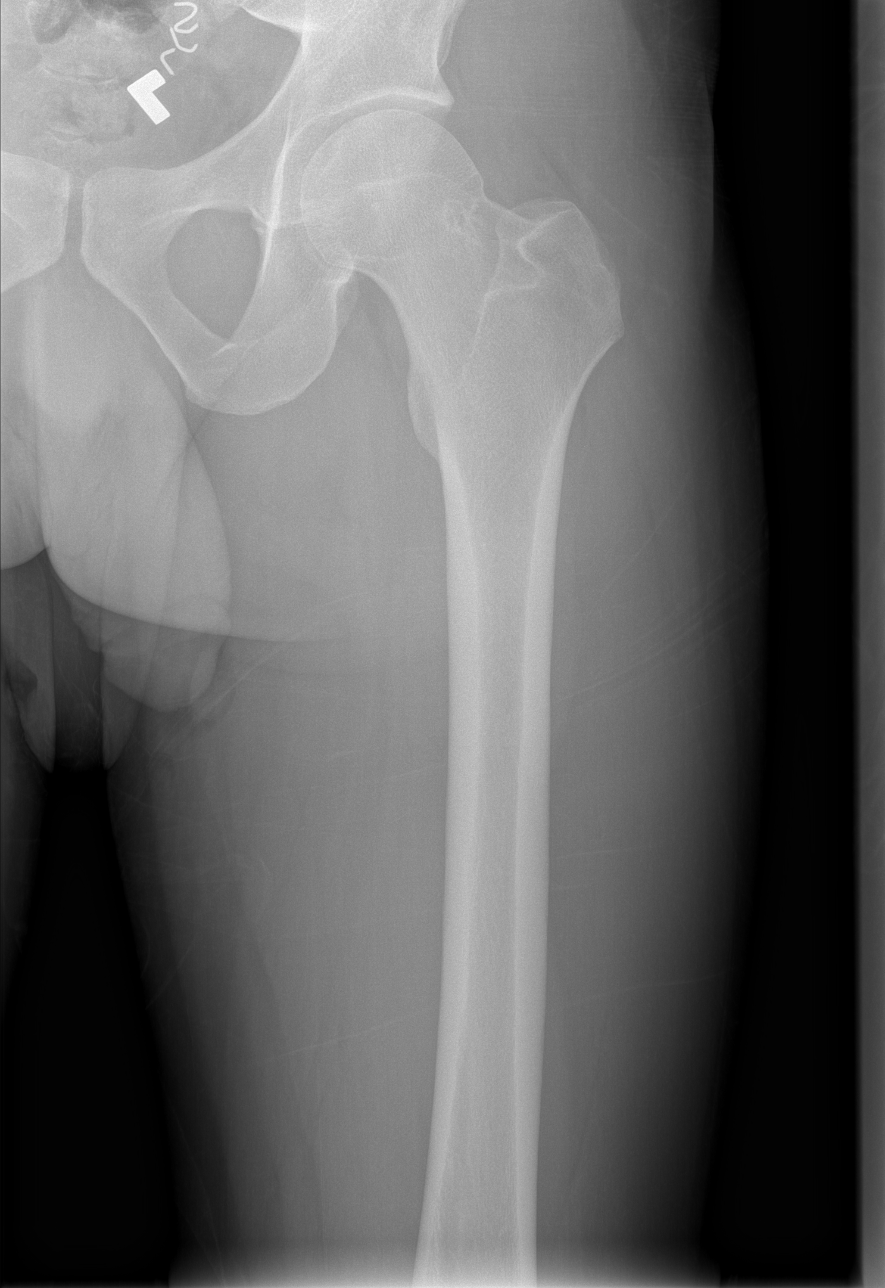

[t femur with knee ap left]
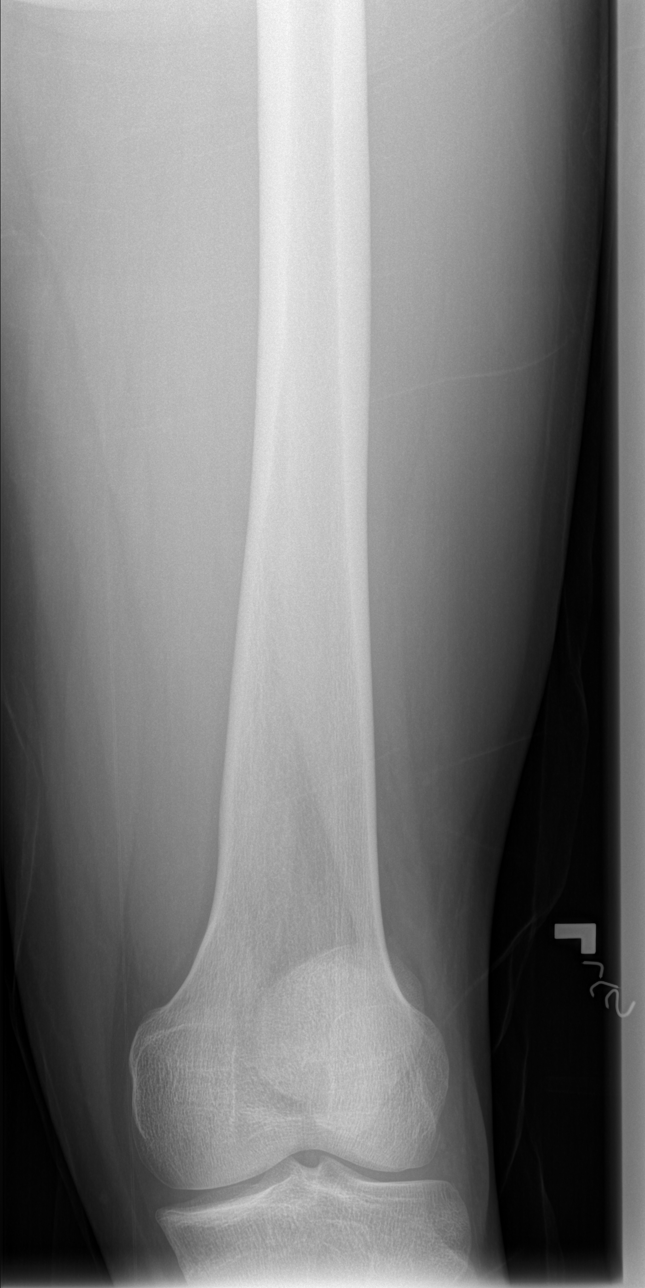

[t femur with hip lat left]
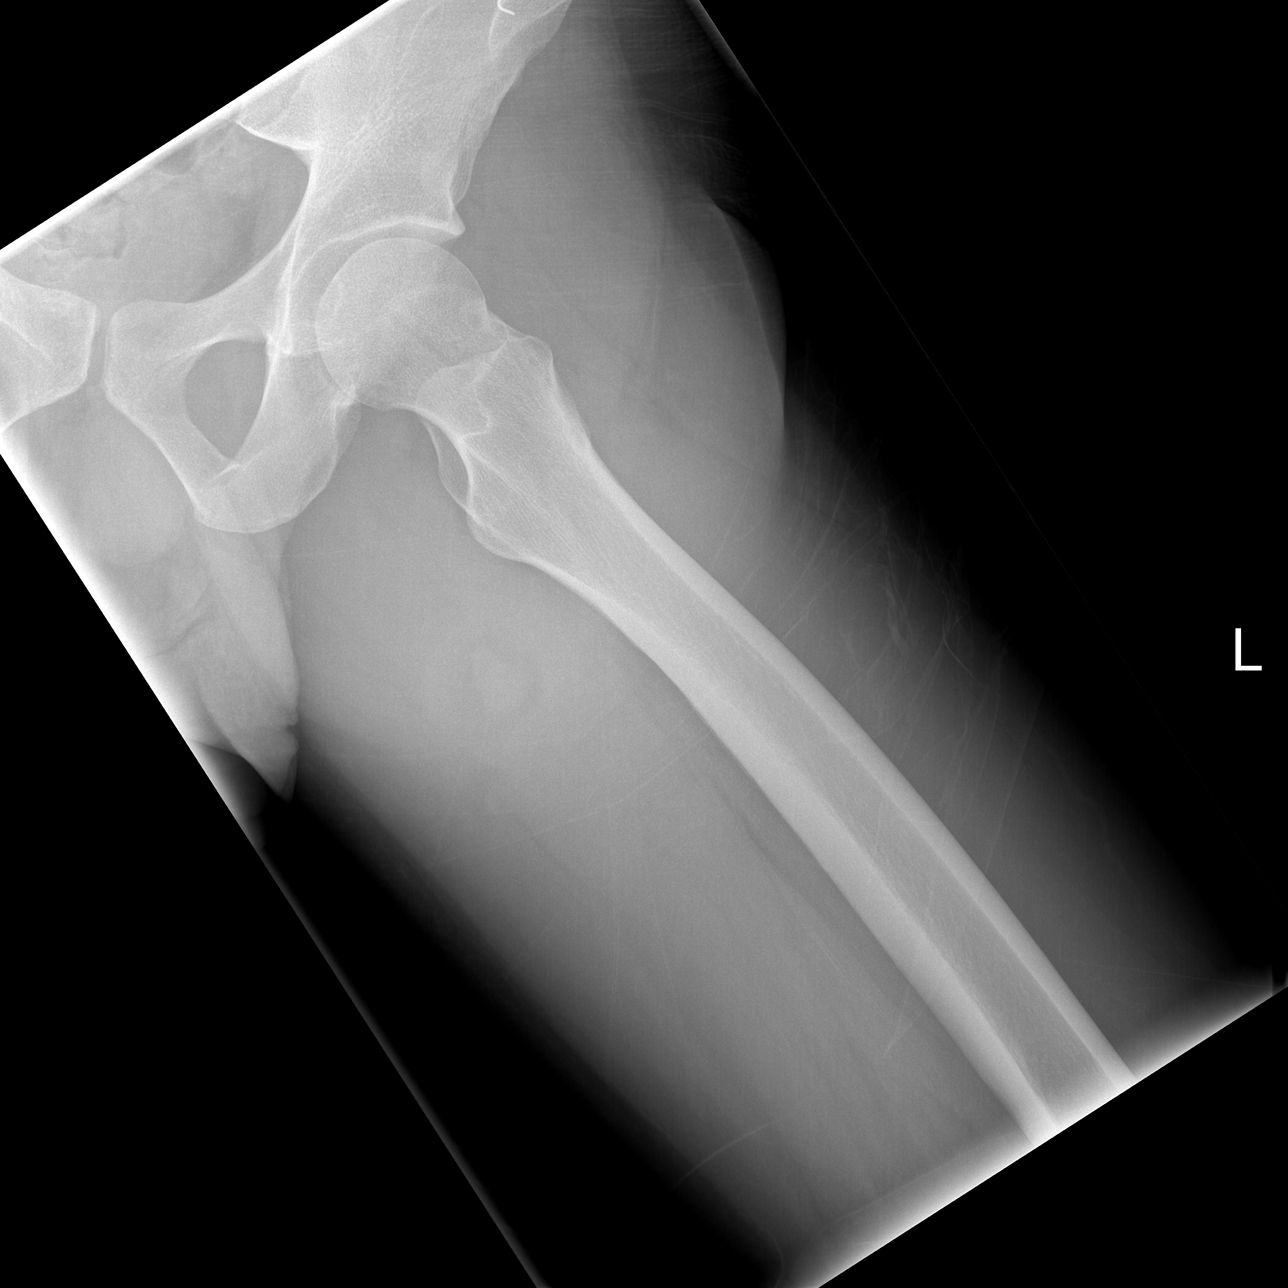

[t femur with knee lat left]
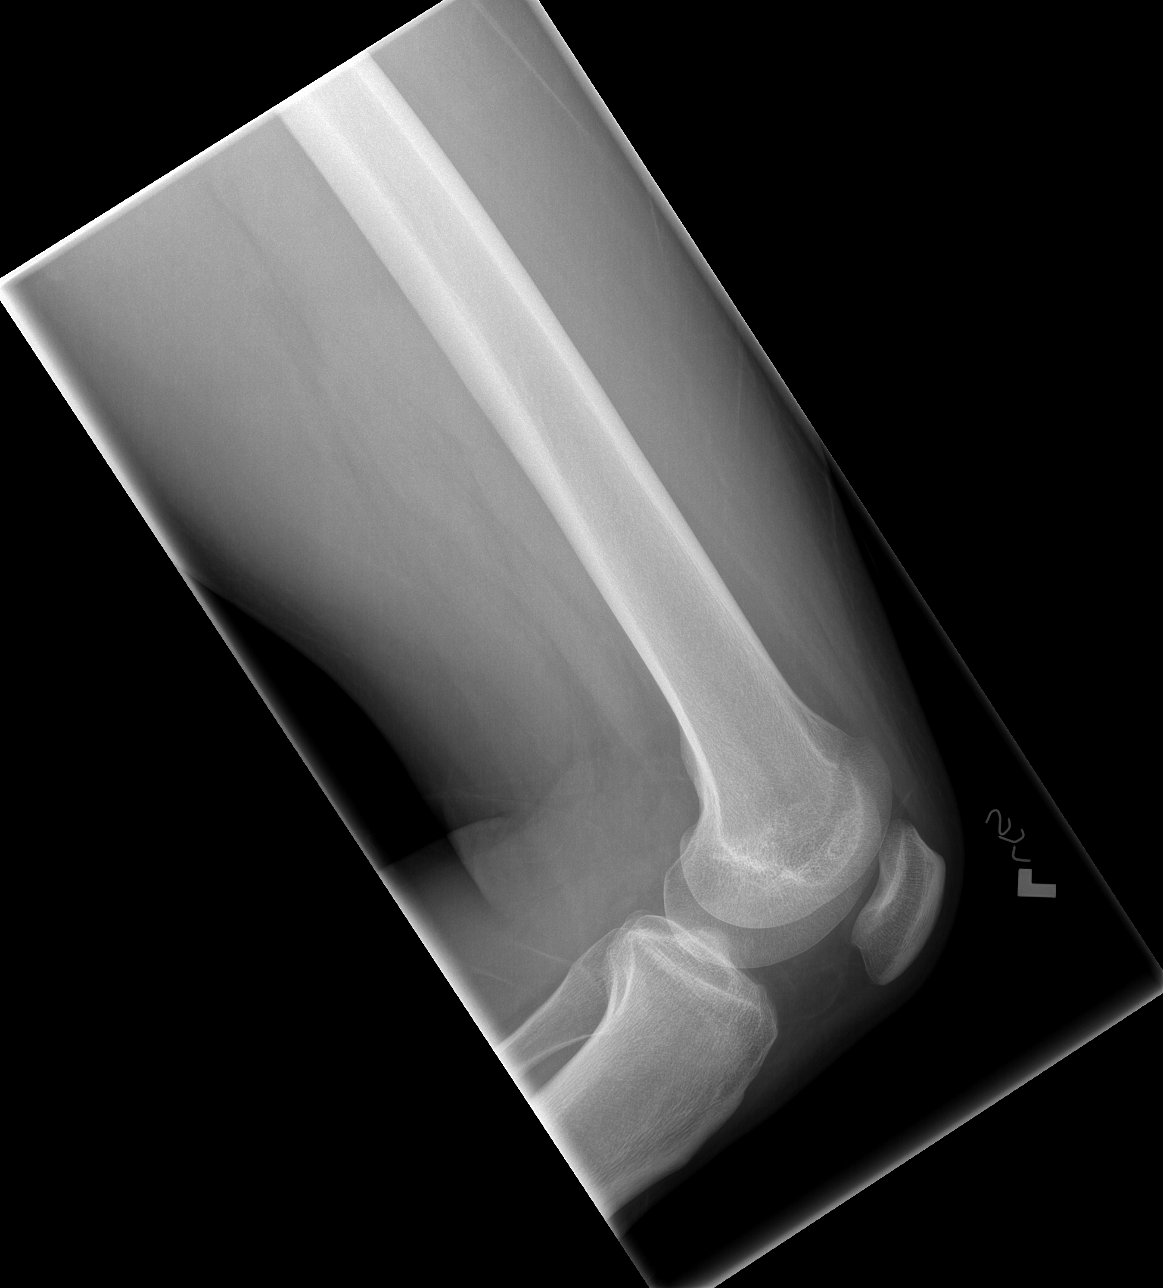

[4 of 4 positions shown; findings below may reference images not displayed]

FINDINGS: No displaced acute fracture or dislocation identified. No
aggressive appearing osseous lesion.  No radiopaque foreign body.
Round soft tissue irregularity involving the inner thigh.
IMPRESSION: No acute or aggressive osseous abnormality.

Round soft tissue irregularity involving the inner thigh.

## 2012-11-16 IMAGING — CT CT PELVIS W/ CM
2 of 3 series · 17 of 46 positions shown, 19 images · IV contrast (APPLIED)
Comparison: Left femur radiographs performed 01/19/2011

CLINICAL DATA: Open wound at left groin, with soft tissue
swelling.  Assess for abscess.

CT PELVIS WITH CONTRAST
TECHNIQUE: Multidetector CT imaging of the pelvis was performed
using the standard protocol following the bolus administration of
intravenous contrast.
Contrast:  80 mL of Omnipaque 300 IV contrast

[Series 3: pelvis 5.0 b31f · axial · 0.96mm/px · z∈[-356,-6]mm · 14 of 82 slices shown, 16 images]
[im 6/82  soft-tissue]
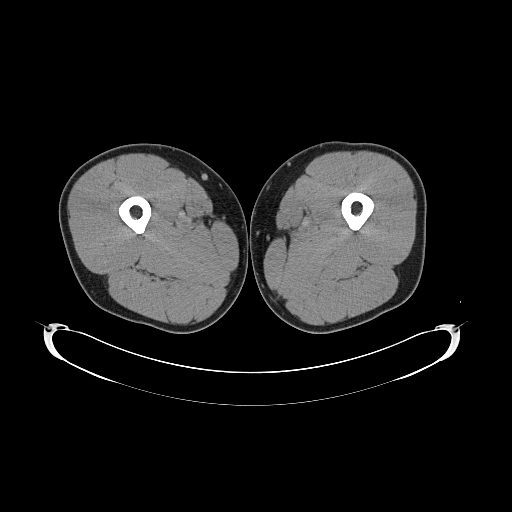
[im 6/82  bone]
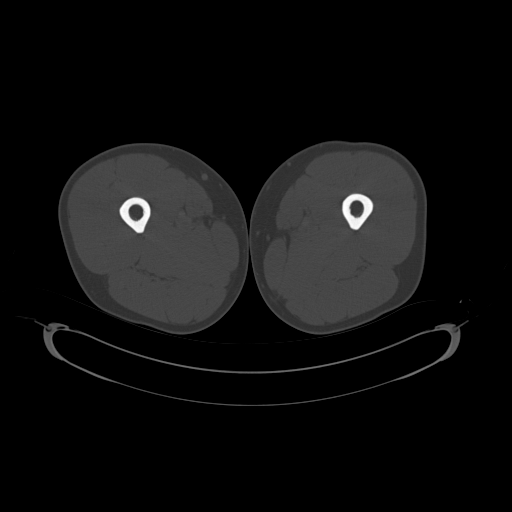
[im 11/82  soft-tissue]
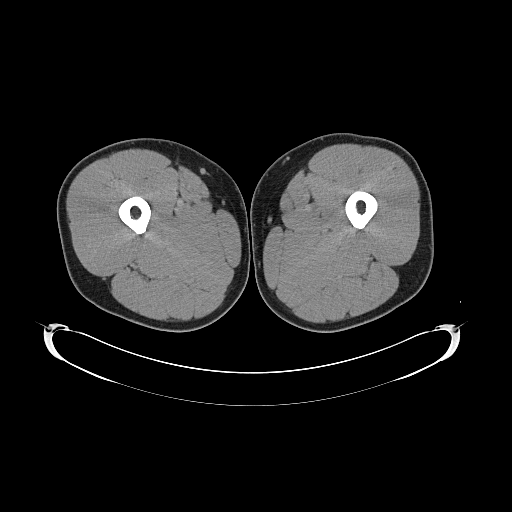
[im 16/82  soft-tissue]
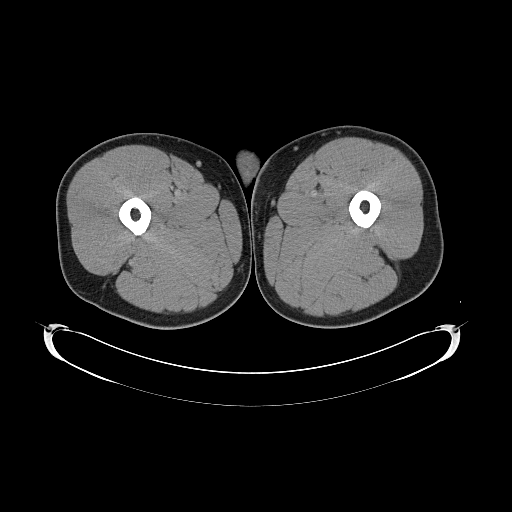
[im 21/82  soft-tissue]
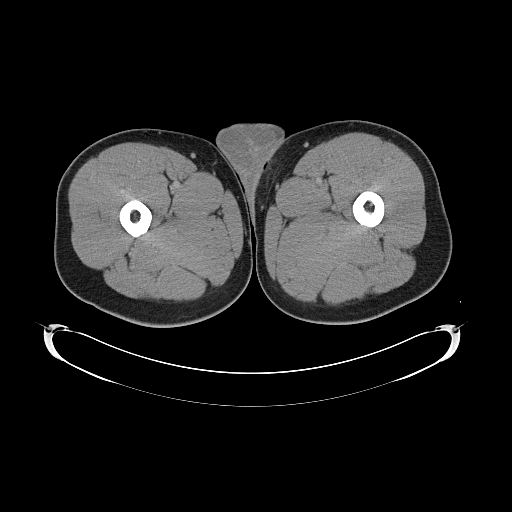
[im 27/82  soft-tissue]
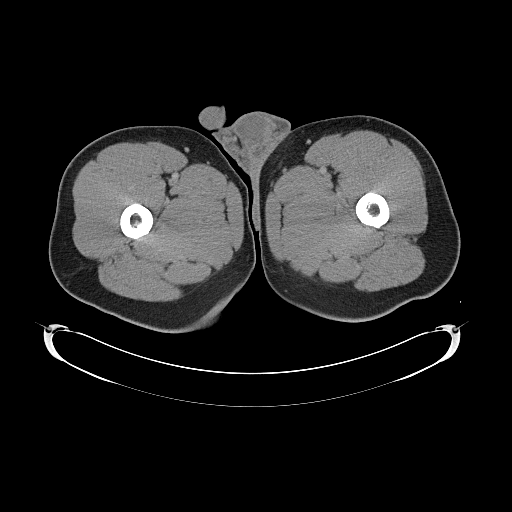
[im 32/82  soft-tissue]
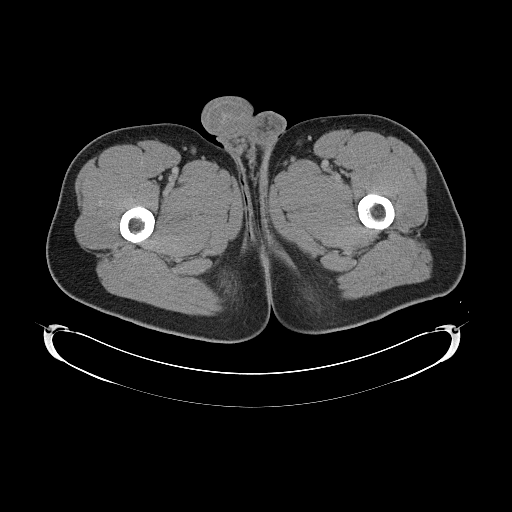
[im 37/82  soft-tissue]
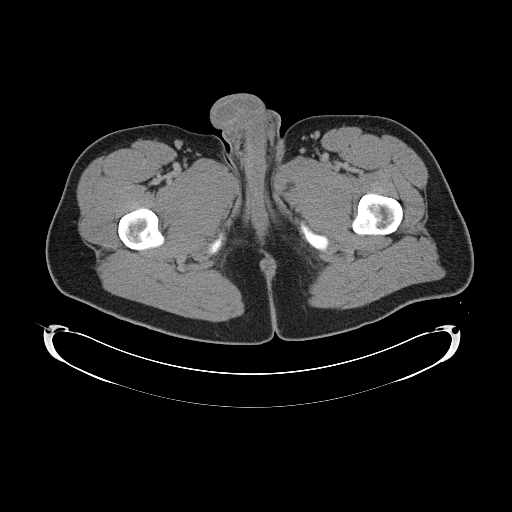
[im 45/82  soft-tissue]
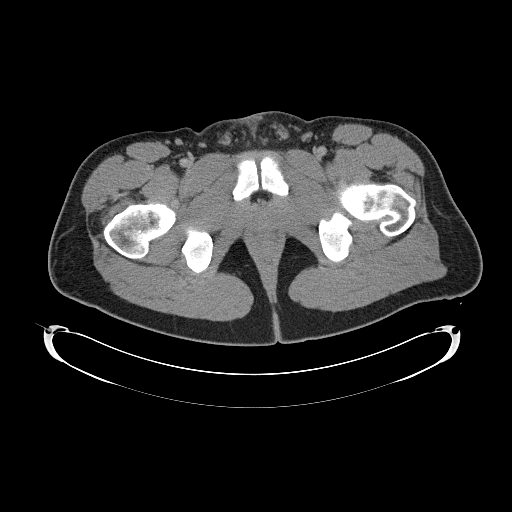
[im 50/82  soft-tissue]
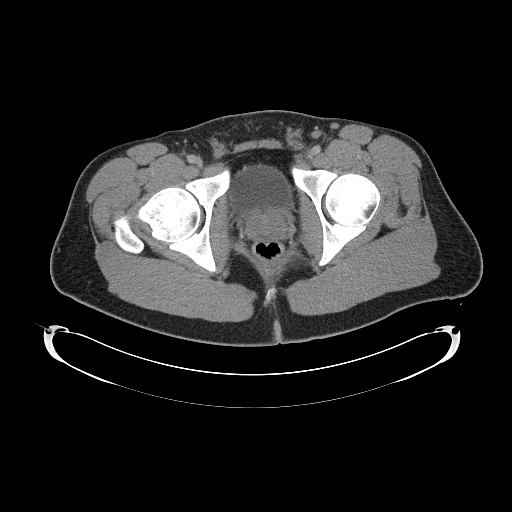
[im 50/82  bone]
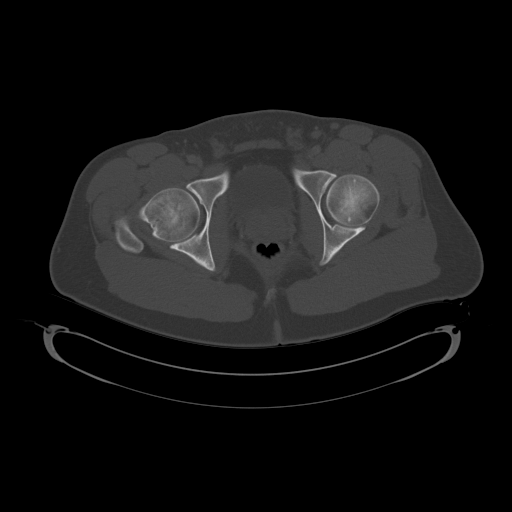
[im 55/82  soft-tissue]
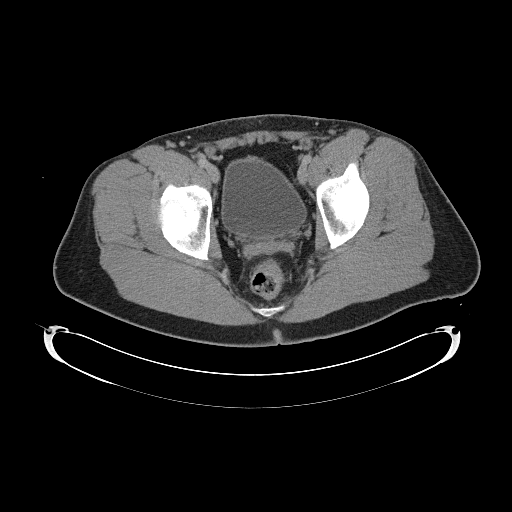
[im 61/82  soft-tissue]
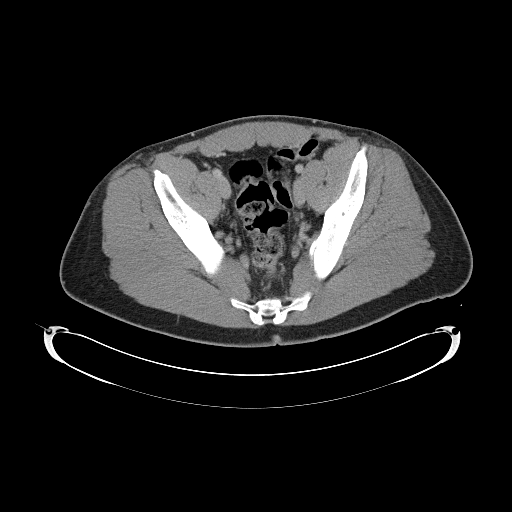
[im 66/82  soft-tissue]
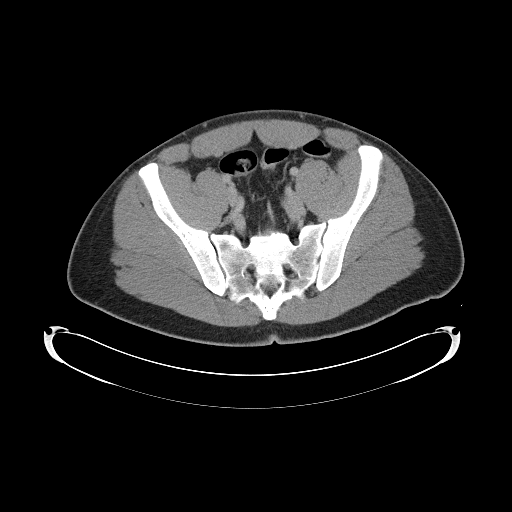
[im 71/82  soft-tissue]
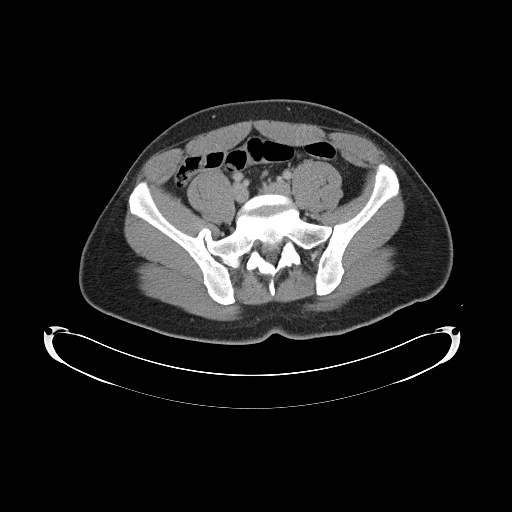
[im 76/82  soft-tissue]
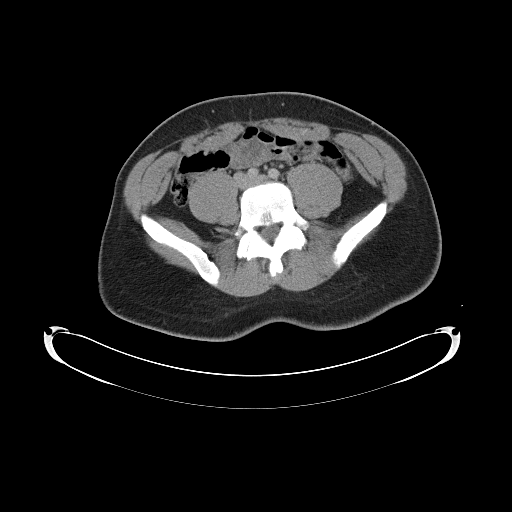

[Series 602: coronals · coronal · 0.96mm/px · 3 of 117 slices shown]
[im 39/117  soft-tissue]
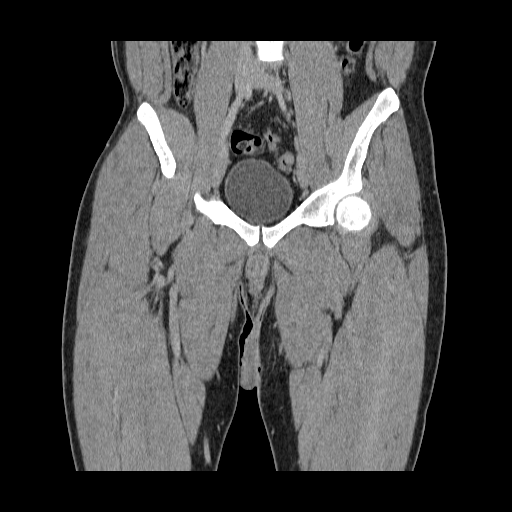
[im 52/117  soft-tissue]
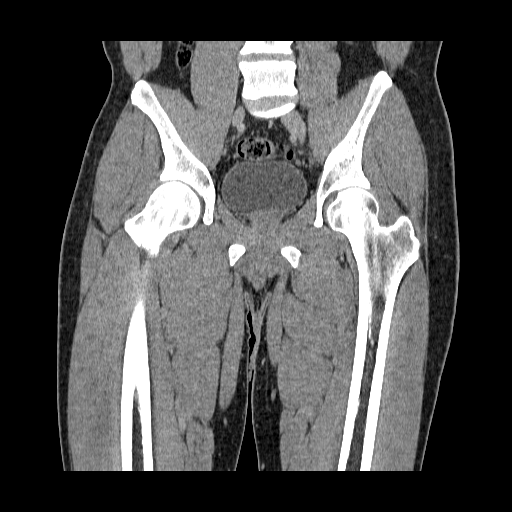
[im 65/117  soft-tissue]
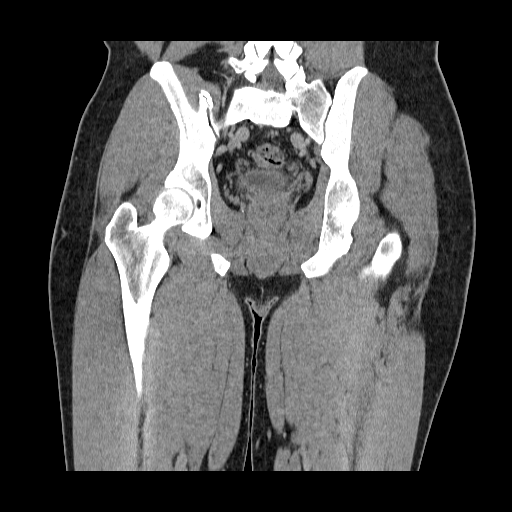

[17 of 46 positions shown; findings below may reference images not displayed]

FINDINGS: The patient's soft tissue wound is very superficial,
with mild focal skin thickening noted along the proximal medial
left thigh, and an apparent corresponding rounded skin defect.
There is stranding within the underlying fat, which remains
superficial to the underlying vasculature and fascia.  There is no
evidence of muscular involvement.

There is no evidence of abscess.  No additional soft tissue
abnormalities are seen.  There is no evidence of vascular
compromise.

The visualized small and large bowel are grossly unremarkable in
appearance.  The bladder is mildly distended and within normal
limits.  The prostate is normal in size.  No inguinal
lymphadenopathy is seen.

The scrotum is unremarkable in appearance.

No acute osseous abnormalities are identified.
IMPRESSION: Very superficial soft tissue wound noted at the proximal medial
left thigh, with mild associated focal skin thickening and a
corresponding rounded skin defect.  Stranding noted within the
underlying fat, remaining superficial to the vasculature and
fascia.  No evidence of muscular involvement; no evidence of
abscess.

## 2014-01-13 ENCOUNTER — Emergency Department (HOSPITAL_COMMUNITY)
Admission: EM | Admit: 2014-01-13 | Discharge: 2014-01-14 | Discharge status: Against Medical Advice | Payer: Self-pay | Attending: Emergency Medicine | Admitting: Emergency Medicine

## 2014-01-13 ENCOUNTER — Encounter (HOSPITAL_COMMUNITY): Payer: Self-pay | Admitting: Emergency Medicine

## 2014-01-13 DIAGNOSIS — I10 Essential (primary) hypertension: Secondary | ICD-10-CM | POA: Insufficient documentation

## 2014-01-13 DIAGNOSIS — F17200 Nicotine dependence, unspecified, uncomplicated: Secondary | ICD-10-CM | POA: Insufficient documentation

## 2014-01-13 DIAGNOSIS — R209 Unspecified disturbances of skin sensation: Secondary | ICD-10-CM | POA: Insufficient documentation

## 2014-01-13 LAB — URINALYSIS, ROUTINE W REFLEX MICROSCOPIC
GLUCOSE, UA: NEGATIVE mg/dL
Hgb urine dipstick: NEGATIVE
Ketones, ur: 40 mg/dL — AB
LEUKOCYTES UA: NEGATIVE
NITRITE: NEGATIVE
PH: 5.5 (ref 5.0–8.0)
Protein, ur: NEGATIVE mg/dL
SPECIFIC GRAVITY, URINE: 1.027 (ref 1.005–1.030)
Urobilinogen, UA: 0.2 mg/dL (ref 0.0–1.0)

## 2014-01-13 LAB — CBC WITH DIFFERENTIAL/PLATELET
Basophils Absolute: 0 10*3/uL (ref 0.0–0.1)
Basophils Relative: 0 % (ref 0–1)
EOS PCT: 1 % (ref 0–5)
Eosinophils Absolute: 0.1 10*3/uL (ref 0.0–0.7)
HCT: 46.9 % (ref 39.0–52.0)
Hemoglobin: 16 g/dL (ref 13.0–17.0)
LYMPHS ABS: 2.1 10*3/uL (ref 0.7–4.0)
LYMPHS PCT: 37 % (ref 12–46)
MCH: 32.6 pg (ref 26.0–34.0)
MCHC: 34.1 g/dL (ref 30.0–36.0)
MCV: 95.5 fL (ref 78.0–100.0)
Monocytes Absolute: 0.4 10*3/uL (ref 0.1–1.0)
Monocytes Relative: 8 % (ref 3–12)
NEUTROS ABS: 3.1 10*3/uL (ref 1.7–7.7)
NEUTROS PCT: 54 % (ref 43–77)
PLATELETS: 183 10*3/uL (ref 150–400)
RBC: 4.91 MIL/uL (ref 4.22–5.81)
RDW: 12.4 % (ref 11.5–15.5)
WBC: 5.6 10*3/uL (ref 4.0–10.5)

## 2014-01-13 LAB — CBG MONITORING, ED: Glucose-Capillary: 93 mg/dL (ref 70–99)

## 2014-01-13 NOTE — ED Notes (Signed)
Pt reports L arm and L leg numbness and pain x 1 week, has not been taking his BP med for a year.  Pt also reports fatigue and urinary frequency.

## 2014-01-14 ENCOUNTER — Emergency Department (INDEPENDENT_AMBULATORY_CARE_PROVIDER_SITE_OTHER)
Admission: EM | Admit: 2014-01-14 | Discharge: 2014-01-14 | Disposition: A | Discharge status: Home / Self Care | Payer: Self-pay | Source: Home / Self Care | Attending: Family Medicine | Admitting: Family Medicine

## 2014-01-14 ENCOUNTER — Encounter (HOSPITAL_COMMUNITY): Payer: Self-pay | Admitting: Emergency Medicine

## 2014-01-14 DIAGNOSIS — I10 Essential (primary) hypertension: Secondary | ICD-10-CM

## 2014-01-14 DIAGNOSIS — R209 Unspecified disturbances of skin sensation: Secondary | ICD-10-CM

## 2014-01-14 DIAGNOSIS — R202 Paresthesia of skin: Secondary | ICD-10-CM

## 2014-01-14 LAB — BASIC METABOLIC PANEL
Anion gap: 16 — ABNORMAL HIGH (ref 5–15)
BUN: 14 mg/dL (ref 6–23)
CHLORIDE: 103 meq/L (ref 96–112)
CO2: 21 meq/L (ref 19–32)
Calcium: 9.1 mg/dL (ref 8.4–10.5)
Creatinine, Ser: 1.05 mg/dL (ref 0.50–1.35)
GFR calc Af Amer: >90 mL/min (ref >90)
GFR calc non Af Amer: >90 mL/min (ref >90)
GLUCOSE: 96 mg/dL (ref 70–99)
POTASSIUM: 4.1 meq/L (ref 3.7–5.3)
SODIUM: 140 meq/L (ref 137–147)

## 2014-01-14 MED ORDER — LISINOPRIL 10 MG PO TABS: 10.0000 mg | ORAL_TABLET | Freq: Every day | ORAL | Status: DC

## 2014-01-14 NOTE — ED Notes (Signed)
Patient reports left side numbness for about 7 days: left shoulder, left arm, left leg, pain in left upper arm and left foot.  Patient went to [facility] ed last night did not wait to see physician, wait too long.  Patient here today to follow up on results of labs done in ed last night and for further evaluation.  Patient has been off blood pressure medicine for at least 6 months

## 2014-01-14 NOTE — Discharge Instructions (Signed)
Your lab work from your ER visit last night was normal. Please resume taking your lisinopril as prescribed for your elevated blood pressure. You must begin working on finding a primary care doctor for continued management of your hypertension.   Hypertension Hypertension, commonly called high blood pressure, is when the force of blood pumping through your arteries is too strong. Your arteries are the blood vessels that carry blood from your heart throughout your body. A blood pressure reading consists of a higher number over a lower number, such as 110/72. The higher number (systolic) is the pressure inside your arteries when your heart pumps. The lower number (diastolic) is the pressure inside your arteries when your heart relaxes. Ideally you want your blood pressure below 120/80. Hypertension forces your heart to work harder to pump blood. Your arteries may become narrow or stiff. Having hypertension puts you at risk for heart disease, stroke, and other problems.  RISK FACTORS Some risk factors for high blood pressure are controllable. Others are not.  Risk factors you cannot control include:   Race. You may be at higher risk if you are African American.  Age. Risk increases with age.  Gender. Men are at higher risk than women before age 54 years. After age 31, women are at higher risk than men. Risk factors you can control include:  Not getting enough exercise or physical activity.  Being overweight.  Getting too much fat, sugar, calories, or salt in your diet.  Drinking too much alcohol. SIGNS AND SYMPTOMS Hypertension does not usually cause signs or symptoms. Extremely high blood pressure (hypertensive crisis) may cause headache, anxiety, shortness of breath, and nosebleed. DIAGNOSIS  To check if you have hypertension, your health care provider will measure your blood pressure while you are seated, with your arm held at the level of your heart. It should be measured at least twice  using the same arm. Certain conditions can cause a difference in blood pressure between your right and left arms. A blood pressure reading that is higher than normal on one occasion does not mean that you need treatment. If one blood pressure reading is high, ask your health care provider about having it checked again. TREATMENT  Treating high blood pressure includes making lifestyle changes and possibly taking medicine. Living a healthy lifestyle can help lower high blood pressure. You may need to change some of your habits. Lifestyle changes may include:  Following the DASH diet. This diet is high in fruits, vegetables, and whole grains. It is low in salt, red meat, and added sugars.  Getting at least 2 hours of brisk physical activity every week.  Losing weight if necessary.  Not smoking.  Limiting alcoholic beverages.  Learning ways to reduce stress. If lifestyle changes are not enough to get your blood pressure under control, your health care provider may prescribe medicine. You may need to take more than one. Work closely with your health care provider to understand the risks and benefits. HOME CARE INSTRUCTIONS  Have your blood pressure rechecked as directed by your health care provider.   Take medicines only as directed by your health care provider. Follow the directions carefully. Blood pressure medicines must be taken as prescribed. The medicine does not work as well when you skip doses. Skipping doses also puts you at risk for problems.   Do not smoke.   Monitor your blood pressure at home as directed by your health care provider. SEEK MEDICAL CARE IF:   You think you are  having a reaction to medicines taken.  You have recurrent headaches or feel dizzy.  You have swelling in your ankles.  You have trouble with your vision. SEEK IMMEDIATE MEDICAL CARE IF:  You develop a severe headache or confusion.  You have unusual weakness, numbness, or feel faint.  You  have severe chest or abdominal pain.  You vomit repeatedly.  You have trouble breathing. MAKE SURE YOU:   Understand these instructions.  Will watch your condition.  Will get help right away if you are not doing well or get worse. Document Released: 04/25/2005 Document Revised: 09/09/2013 Document Reviewed: 02/15/2013 [facility] Patient Information 2015 [place], [place]. This information is not intended to replace advice given to you by your health care provider. Make sure you discuss any questions you have with your health care provider.  Paresthesia Paresthesia is an abnormal burning or prickling sensation. This sensation is generally felt in the hands, arms, legs, or feet. However, it may occur in any part of the body. It is usually not painful. The feeling may be described as:  Tingling or numbness.  "Pins and needles."  Skin crawling.  Buzzing.  Limbs "falling asleep."  Itching. Most people experience temporary (transient) paresthesia at some time in their lives. CAUSES  Paresthesia may occur when you breathe too quickly (hyperventilation). It can also occur without any apparent cause. Commonly, paresthesia occurs when pressure is placed on a nerve. The feeling quickly goes away once the pressure is removed. For some people, however, paresthesia is a long-lasting (chronic) condition caused by an underlying disorder. The underlying disorder may be:  A traumatic, direct injury to nerves. Examples include a:  Broken (fractured) neck.  Fractured skull.  A disorder affecting the brain and spinal cord (central nervous system). Examples include:  Transverse myelitis.  Encephalitis.  Transient ischemic attack.  Multiple sclerosis.  Stroke.  Tumor or blood vessel problems, such as an arteriovenous malformation pressing against the brain or spinal cord.  A condition that damages the peripheral nerves (peripheral neuropathy). Peripheral nerves are not part of the brain and  spinal cord. These conditions include:  Diabetes.  Peripheral vascular disease.  Nerve entrapment syndromes, such as carpal tunnel syndrome.  Shingles.  Hypothyroidism.  Vitamin B12 deficiencies.  Alcoholism.  Heavy metal poisoning (lead, arsenic).  Rheumatoid arthritis.  Systemic lupus erythematosus. DIAGNOSIS  Your caregiver will attempt to find the underlying cause of your paresthesia. Your caregiver may:  Take your medical history.  Perform a physical exam.  Order various lab tests.  Order imaging tests. TREATMENT  Treatment for paresthesia depends on the underlying cause. HOME CARE INSTRUCTIONS  Avoid drinking alcohol.  You may consider massage or acupuncture to help relieve your symptoms.  Keep all follow-up appointments as directed by your caregiver. SEEK IMMEDIATE MEDICAL CARE IF:   You feel weak.  You have trouble walking or moving.  You have problems with speech or vision.  You feel confused.  You cannot control your bladder or bowel movements.  You feel numbness after an injury.  You faint.  Your burning or prickling feeling gets worse when walking.  You have pain, cramps, or dizziness.  You develop a rash. MAKE SURE YOU:  Understand these instructions.  Will watch your condition.  Will get help right away if you are not doing well or get worse. Document Released: 04/15/2002 Document Revised: 07/18/2011 Document Reviewed: 01/14/2011 [facility] Patient Information 2015 [place], [place]. This information is not intended to replace advice given to you by your health  care provider. Make sure you discuss any questions you have with your health care provider.

## 2014-01-14 NOTE — ED Provider Notes (Signed)
CSN: [id number]     Arrival date & time 01/14/14  1206 History   First MD Initiated Contact with Patient 01/14/14 1310     Chief Complaint  Patient presents with  . Numbness  . Hypertension   (Consider location/radiation/quality/duration/timing/severity/associated sxs/prior Treatment) HPI Comments: Patient states he went to be seen at the [facility] ER last night for intermittent, migratory paresthesias that have occurred over the past 7-10 days. States he is also concerned that he has been off his blood pressure medication for about a year and wishes to restart medication. States he was evaluated by triage staff at ER and blood and urine studies obtained but he never saw a provider and did not wait for lab results (LWBS).  He is here today requesting lab results and medication for HTN.   The history is provided by the patient.    Past Medical History  Diagnosis Date  . Hypertension    Past Surgical History  Procedure Laterality Date  . Ankle surgery      left ankle   No family history on file. History  Substance Use Topics  . Smoking status: Current Every Day Smoker    Types: Cigarettes  . Smokeless tobacco: Not on file  . Alcohol Use: Yes    Review of Systems  All other systems reviewed and are negative.   Allergies  Review of patient's allergies indicates no known allergies.  Home Medications   Prior to Admission medications   Medication Sig Start Date End Date Taking? Authorizing Provider  lisinopril (PRINIVIL,ZESTRIL) 10 MG tablet Take 1 tablet (10 mg total) by mouth daily. 01/24/12   Jimmie Molly, MD  lisinopril (PRINIVIL,ZESTRIL) 10 MG tablet Take 1 tablet (10 mg total) by mouth daily. 01/14/14   Jess Barters H Sharie Amorin, PA   BP 158/98  Pulse 82  Temp(Src) 98.9 F (37.2 C) (Oral)  Resp 18  SpO2 98% Physical Exam  Nursing note and vitals reviewed. Constitutional: He is oriented to person, place, and time. He appears well-developed and well-nourished. No  distress.  HENT:  Head: Normocephalic and atraumatic.  Nose: Nose normal.  Mouth/Throat: Oropharynx is clear and moist.  Eyes: Conjunctivae are normal. No scleral icterus.  Neck: Normal range of motion. Neck supple.  Cardiovascular: Normal rate, regular rhythm and normal heart sounds.   Pulmonary/Chest: Effort normal and breath sounds normal. No respiratory distress. He has no wheezes.  Abdominal: Soft. Bowel sounds are normal. He exhibits no distension. There is no tenderness.  Musculoskeletal: Normal range of motion.  FROM and symmetrical ROM at all 4 extremities.   Lymphadenopathy:    He has no cervical adenopathy.  Neurological: He is alert and oriented to person, place, and time. He has normal strength. No cranial nerve deficit or sensory deficit. He exhibits normal muscle tone. He displays a negative Romberg sign. Coordination and gait normal. GCS eye subscore is 4. GCS verbal subscore is 5. GCS motor subscore is 6.  Reflex Scores:      Patellar reflexes are 2+ on the right side and 2+ on the left side. Skin: Skin is warm and dry. No rash noted. No erythema.  Psychiatric: He has a normal mood and affect. His behavior is normal.    ED Course  Procedures (including critical care time) Labs Review Labs Reviewed - No data to display  Imaging Review No results found.   MDM   1. HYPERTENSION, BENIGN ESSENTIAL   2. Paresthesias   Lab results (CBC, BMP, UA) reviewed from  overnight ER visit and all results normal. Result provided to patient.  Exam without any sensory or strength deficits.  BP elevated and will provide patient with 1 month or lisinopril for HTN Instructed to locate PCP of his choice to establish for follow up.   Ria Clock, [place] 01/16/14 1310

## 2014-01-14 NOTE — ED Notes (Signed)
Called pt to go to treatment room  Pt seen walking out of ED  Micah Flesher out to get pt and pt was driving away

## 2014-01-18 NOTE — ED Provider Notes (Signed)
Medical screening examination/treatment/procedure(s) were performed by a resident physician or non-physician practitioner and as the supervising physician I was immediately available for consultation/collaboration.  Wendle Kina, MD    Dawnisha Marquina S Prudy Candy, MD 01/18/14 0858 

## 2014-12-30 ENCOUNTER — Ambulatory Visit (INDEPENDENT_AMBULATORY_CARE_PROVIDER_SITE_OTHER): Payer: Self-pay | Admitting: Family Medicine

## 2014-12-30 VITALS — BP 132/86 | HR 78 | Temp 98.5°F | Resp 18 | Ht 69.5 in | Wt 208.0 lb

## 2014-12-30 DIAGNOSIS — R5383 Other fatigue: Secondary | ICD-10-CM

## 2014-12-30 DIAGNOSIS — R7303 Prediabetes: Secondary | ICD-10-CM

## 2014-12-30 DIAGNOSIS — I10 Essential (primary) hypertension: Secondary | ICD-10-CM

## 2014-12-30 DIAGNOSIS — R7309 Other abnormal glucose: Secondary | ICD-10-CM

## 2014-12-30 DIAGNOSIS — R0789 Other chest pain: Secondary | ICD-10-CM

## 2014-12-30 DIAGNOSIS — M79602 Pain in left arm: Secondary | ICD-10-CM

## 2014-12-30 DIAGNOSIS — R9431 Abnormal electrocardiogram [ECG] [EKG]: Secondary | ICD-10-CM

## 2014-12-30 DIAGNOSIS — Z720 Tobacco use: Secondary | ICD-10-CM

## 2014-12-30 DIAGNOSIS — H538 Other visual disturbances: Secondary | ICD-10-CM

## 2014-12-30 LAB — POCT CBC
GRANULOCYTE PERCENT: 61.6 % (ref 37–80)
HEMATOCRIT: 49.8 % (ref 43.5–53.7)
Hemoglobin: 15.4 g/dL (ref 14.1–18.1)
Lymph, poc: 2.2 (ref 0.6–3.4)
MCH, POC: 28.5 pg (ref 27–31.2)
MCHC: 30.9 g/dL — AB (ref 31.8–35.4)
MCV: 92.3 fL (ref 80–97)
MID (CBC): 0.1 (ref 0–0.9)
MPV: 7 fL (ref 0–99.8)
PLATELET COUNT, POC: 204 10*3/uL (ref 142–424)
POC GRANULOCYTE: 3.7 (ref 2–6.9)
POC LYMPH %: 36.2 % (ref 10–50)
POC MID %: 2.2 %M (ref 0–12)
RBC: 5.39 M/uL (ref 4.69–6.13)
RDW, POC: 13 %
WBC: 6 10*3/uL (ref 4.6–10.2)

## 2014-12-30 LAB — POCT SEDIMENTATION RATE: POCT SED RATE: 3 mm/hr (ref 0–22)

## 2014-12-30 LAB — GLUCOSE, POCT (MANUAL RESULT ENTRY): POC GLUCOSE: 102 mg/dL — AB (ref 70–99)

## 2014-12-30 LAB — POCT GLYCOSYLATED HEMOGLOBIN (HGB A1C): HEMOGLOBIN A1C: 5.8

## 2014-12-30 LAB — TROPONIN I

## 2014-12-30 MED ORDER — LISINOPRIL 10 MG PO TABS: 10.0000 mg | ORAL_TABLET | Freq: Every day | ORAL | Status: DC

## 2014-12-30 NOTE — Patient Instructions (Addendum)
You should receive a call or letter about your lab results within the next week to 10 days.  Follow up with primary provider (we can schedule you with one here if you would like) in the next 6 weeks for fasting lab testing (cholesterol, liver tests) and to recheck blood pressure and symptoms from tonight.   For your arm symptoms and chest tightness, I will check other blood tests tonight to make sure this does not look like it's from your heart or inflammation around your heart. If these are abnormal, I will call you so that you can be seen through the emergency room if needed.  You do have an abnormal EKG, so you will need to be evaluated by cardiology. They will call you to schedule that appointment, but if any increased chest tightness or any of your symptoms worsen - go to the emergency room, or call 911.   Your blood sugar is still in the range of prediabetes, so should not be contributing to the fatigue or blurry vision at this level. I would recommend he be evaluated by an ophthalmologist or eye care specialist for your blurry vision.  We can refer you if needed, or you can find one on your own. Please let me know if you need a referral. Work on diet, and weight loss to help with control his blood sugar. This should be rechecked by her primary care provider within the next 3 months.   Return to the clinic or go to the nearest emergency room if any of your symptoms worsen or new symptoms occur.  Hyperglycemia Hyperglycemia occurs when the glucose (sugar) in your blood is too high. Hyperglycemia can happen for many reasons, but it most often happens to people who do not know they have diabetes or are not managing their diabetes properly.  CAUSES  Whether you have diabetes or not, there are other causes of hyperglycemia. Hyperglycemia can occur when you have diabetes, but it can also occur in other situations that you might not be as aware of, such as: Diabetes  If you have diabetes and are having  problems controlling your blood glucose, hyperglycemia could occur because of some of the following reasons:  Not following your meal plan.  Not taking your diabetes medications or not taking it properly.  Exercising less or doing less activity than you normally do.  Being sick. Pre-diabetes  This cannot be ignored. Before people develop Type 2 diabetes, they almost always have "pre-diabetes." This is when your blood glucose levels are higher than normal, but not yet high enough to be diagnosed as diabetes. Research has shown that some long-term damage to the body, especially the heart and circulatory system, may already be occurring during pre-diabetes. If you take action to manage your blood glucose when you have pre-diabetes, you may delay or prevent Type 2 diabetes from developing. Stress  If you have diabetes, you may be "diet" controlled or on oral medications or insulin to control your diabetes. However, you may find that your blood glucose is higher than usual in the hospital whether you have diabetes or not. This is often referred to as "stress hyperglycemia." Stress can elevate your blood glucose. This happens because of hormones put out by the body during times of stress. If stress has been the cause of your high blood glucose, it can be followed regularly by your caregiver. That way he/she can make sure your hyperglycemia does not continue to get worse or progress to diabetes. Steroids  Steroids are medications that act on the infection fighting system (immune system) to block inflammation or infection. One side effect can be a rise in blood glucose. Most people can produce enough extra insulin to allow for this rise, but for those who cannot, steroids make blood glucose levels go even higher. It is not unusual for steroid treatments to "uncover" diabetes that is developing. It is not always possible to determine if the hyperglycemia will go away after the steroids are stopped. A special  blood test called an A1c is sometimes done to determine if your blood glucose was elevated before the steroids were started. SYMPTOMS  Thirsty.  Frequent urination.  Dry mouth.  Blurred vision.  Tired or fatigue.  Weakness.  Sleepy.  Tingling in feet or leg. DIAGNOSIS  Diagnosis is made by monitoring blood glucose in one or all of the following ways:  A1c test. This is a chemical found in your blood.  Fingerstick blood glucose monitoring.  Laboratory results. TREATMENT  First, knowing the cause of the hyperglycemia is important before the hyperglycemia can be treated. Treatment may include, but is not be limited to:  Education.  Change or adjustment in medications.  Change or adjustment in meal plan.  Treatment for an illness, infection, etc.  More frequent blood glucose monitoring.  Change in exercise plan.  Decreasing or stopping steroids.  Lifestyle changes. HOME CARE INSTRUCTIONS   Test your blood glucose as directed.  Exercise regularly. Your caregiver will give you instructions about exercise. Pre-diabetes or diabetes which comes on with stress is helped by exercising.  Eat wholesome, balanced meals. Eat often and at regular, fixed times. Your caregiver or nutritionist will give you a meal plan to guide your sugar intake.  Being at an ideal weight is important. If needed, losing as little as 10 to 15 pounds may help improve blood glucose levels. SEEK MEDICAL CARE IF:   You have questions about medicine, activity, or diet.  You continue to have symptoms (problems such as increased thirst, urination, or weight gain). SEEK IMMEDIATE MEDICAL CARE IF:   You are vomiting or have diarrhea.  Your breath smells fruity.  You are breathing faster or slower.  You are very sleepy or incoherent.  You have numbness, tingling, or pain in your feet or hands.  You have chest pain.  Your symptoms get worse even though you have been following your caregiver's  orders.  If you have any other questions or concerns. Document Released: 10/19/2000 Document Revised: 07/18/2011 Document Reviewed: 08/22/2011 [facility] Patient Information 2015 [place], [place]. This information is not intended to replace advice given to you by your health care provider. Make sure you discuss any questions you have with your health care provider. Hypertension Hypertension, commonly called high blood pressure, is when the force of blood pumping through your arteries is too strong. Your arteries are the blood vessels that carry blood from your heart throughout your body. A blood pressure reading consists of a higher number over a lower number, such as 110/72. The higher number (systolic) is the pressure inside your arteries when your heart pumps. The lower number (diastolic) is the pressure inside your arteries when your heart relaxes. Ideally you want your blood pressure below 120/80. Hypertension forces your heart to work harder to pump blood. Your arteries may become narrow or stiff. Having hypertension puts you at risk for heart disease, stroke, and other problems.  RISK FACTORS Some risk factors for high blood pressure are controllable. Others are not.  Risk factors you cannot control include:   Race. You may be at higher risk if you are African American.  Age. Risk increases with age.  Gender. Men are at higher risk than women before age 62 years. After age 87, women are at higher risk than men. Risk factors you can control include:  Not getting enough exercise or physical activity.  Being overweight.  Getting too much fat, sugar, calories, or salt in your diet.  Drinking too much alcohol. SIGNS AND SYMPTOMS Hypertension does not usually cause signs or symptoms. Extremely high blood pressure (hypertensive crisis) may cause headache, anxiety, shortness of breath, and nosebleed. DIAGNOSIS  To check if you have hypertension, your health care provider will measure your  blood pressure while you are seated, with your arm held at the level of your heart. It should be measured at least twice using the same arm. Certain conditions can cause a difference in blood pressure between your right and left arms. A blood pressure reading that is higher than normal on one occasion does not mean that you need treatment. If one blood pressure reading is high, ask your health care provider about having it checked again. TREATMENT  Treating high blood pressure includes making lifestyle changes and possibly taking medicine. Living a healthy lifestyle can help lower high blood pressure. You may need to change some of your habits. Lifestyle changes may include:  Following the DASH diet. This diet is high in fruits, vegetables, and whole grains. It is low in salt, red meat, and added sugars.  Getting at least 2 hours of brisk physical activity every week.  Losing weight if necessary.  Not smoking.  Limiting alcoholic beverages.  Learning ways to reduce stress. If lifestyle changes are not enough to get your blood pressure under control, your health care provider may prescribe medicine. You may need to take more than one. Work closely with your health care provider to understand the risks and benefits. HOME CARE INSTRUCTIONS  Have your blood pressure rechecked as directed by your health care provider.   Take medicines only as directed by your health care provider. Follow the directions carefully. Blood pressure medicines must be taken as prescribed. The medicine does not work as well when you skip doses. Skipping doses also puts you at risk for problems.   Do not smoke.   Monitor your blood pressure at home as directed by your health care provider. SEEK MEDICAL CARE IF:   You think you are having a reaction to medicines taken.  You have recurrent headaches or feel dizzy.  You have swelling in your ankles.  You have trouble with your vision. SEEK IMMEDIATE MEDICAL  CARE IF:  You develop a severe headache or confusion.  You have unusual weakness, numbness, or feel faint.  You have severe chest or abdominal pain.  You vomit repeatedly.  You have trouble breathing. MAKE SURE YOU:   Understand these instructions.  Will watch your condition.  Will get help right away if you are not doing well or get worse. Document Released: 04/25/2005 Document Revised: 09/09/2013 Document Reviewed: 02/15/2013 [facility] Patient Information 2015 [place], [place]. This information is not intended to replace advice given to you by your health care provider. Make sure you discuss any questions you have with your health care provider.

## 2014-12-30 NOTE — Progress Notes (Signed)
Patient calling from pharmacy reporting that his medication was not sent electronically.  [facility] checked and this is confirmed. Printed script found and destroyed by me.  I have refilled per Dr. Paralee Cancel note and sent to pharmacy electronically. Deliah Boston, MS, PA-C   7:26 PM, 12/30/2014

## 2014-12-30 NOTE — Progress Notes (Addendum)
Subjective:  This chart was scribed for Jason Staggers, MD by Jason Marsh, Medical Scribe. This patient was seen in room 12 and the patient's care was started 5:26 PM.    Patient ID: Jason Marsh, male    DOB: 1981/01/13, 34 y.o.   MRN: [id number]  HPI Jason Marsh is a 34 y.o. male Here for multiple concerns today including refill of lisinopril 10mg , blurred vision, left arm pain and fatigue.  Most recent eval was in sept 2015 at [facility] urgent care. He was off his BP medicine for a year at that time. He was given 1 month of medication with instructions to locate a PCP for follow up. Of note, he was seen in the ER the night prior but walked out of the ER. Prior to eval by provider, he did have BMP and CBC at that time. Also had a urine test without protein.   He denies having a PCP. He didn't follow up with a PCP due to insurance. He's been treated by different [facility]gent [facility] since Dr. Denyse Marsh. Last year, around sept 2015, he started the lisinopril. He was previously on another medication prior to lisinopril but back in 2012, he went to the hospital for staph infection and the a1c considered pre-DM. In Dec 2015, he visited a clinic and was still considered pre-DM.   He notes that his blurred vision is intermittent and has been around for a year.  He also notes being fatigue for the past year without any recent changes.  He also informs his left arm pain being around for about a year. He sometimes feels chest tightness and dizziness with the arm pain. He notes he doesn't have any association with this problem. He denies any injuries to the area. He denies chest pain, fever, chills, nausea, diarrhea, vomiting. He denies any Fhx of heart problems or early cardiac death.   He smokes cigarettes, about 3-4 cigarettes a day. He denies any marijuana use.   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 He denies regular exercise or exertion.   He is also expecting insurance  from his new job in 2 weeks.    Patient Active Problem List   Diagnosis Date Noted  . TOBACCO ABUSE 03/24/2010  . HYPERTENSION, BENIGN ESSENTIAL 03/24/2010   Past Medical History  Diagnosis Date  . Hypertension   . Diabetes mellitus without complication    Past Surgical History  Procedure Laterality Date  . Ankle surgery      left ankle   No Known Allergies Prior to Admission medications   Medication Sig Start Date End Date Taking? Authorizing Provider  lisinopril (PRINIVIL,ZESTRIL) 10 MG tablet Take 1 tablet (10 mg total) by mouth daily. 01/24/12  Yes Jason Molly, MD   Social History   Social History  . Marital Status: Single    Spouse Name: N/A  . Number of Children: N/A  . Years of Education: N/A   Occupational History  . Not on file.   Social History Main Topics  . Smoking status: Current Every Day Smoker    Types: Cigarettes  . Smokeless tobacco: Not on file  . Alcohol Use: Yes  . Drug Use: No  . Sexual Activity: Not on file   Other Topics Concern  . Not on file   Social History Narrative     Review of Systems  Constitutional: Positive for fatigue. Negative for fever and chills.  Respiratory: Positive for chest tightness.   Cardiovascular: Negative  for chest pain.  Gastrointestinal: Negative for nausea, vomiting and diarrhea.  Genitourinary: Positive for frequency.  Musculoskeletal: Positive for arthralgias (arm pain).       Objective:   Physical Exam  Constitutional: He is oriented to person, place, and time. He appears well-developed and well-nourished.  HENT:  Head: Normocephalic and atraumatic.  Eyes: EOM are normal. Pupils are equal, round, and reactive to light.  Neck: Normal range of motion. No JVD present. Carotid bruit is not present.  full ROM cervical spine, did not reproduce arm pain  Cardiovascular: Normal rate, regular rhythm and normal heart sounds.   No murmur heard. Pulmonary/Chest: Effort normal and breath sounds normal. He has  no rales.  Musculoskeletal: He exhibits no edema.  left arm:  Left shoulder full ROM, no bony tenderness, no tenderness on palpation, full RTC strength, negative neer, but reproduce upper arm symptoms with hawkins  Neurological: He is alert and oriented to person, place, and time.  Skin: Skin is warm and dry.  Psychiatric: He has a normal mood and affect.  Vitals reviewed.   Filed Vitals:   12/30/14 1629  BP: 132/86  Pulse: 78  Temp: 98.5 F (36.9 C)  TempSrc: Oral  Resp: 18  Height: 5' 9.5" (1.765 m)  Weight: 208 lb (94.348 kg)  SpO2: 98%   EKG sinus rhythm rate 82 ST elevation in multiple leads, including inferior and precordial leads without prior EKG for review.   After EKG, denies any current chest pain or tightness, but is having the discomfort in his left arm.  This sensation lasts for minutes on and off throughout the day and is not associated with any activity.    Results for orders placed or performed in visit on 12/30/14  POCT glucose (manual entry)  Result Value Ref Range   POC Glucose 102 (A) 70 - 99 mg/dl  POCT glycosylated hemoglobin (Hb A1C)  Result Value Ref Range   Hemoglobin A1C 5.8   POCT CBC  Result Value Ref Range   WBC 6.0 4.6 - 10.2 K/uL   Lymph, poc 2.2 0.6 - 3.4   POC LYMPH PERCENT 36.2 10 - 50 %L   MID (cbc) 0.1 0 - 0.9   POC MID % 2.2 0 - 12 %M   POC Granulocyte 3.7 2 - 6.9   Granulocyte percent 61.6 37 - 80 %G   RBC 5.39 4.69 - 6.13 M/uL   Hemoglobin 15.4 14.1 - 18.1 g/dL   HCT, POC 16.1 09.6 - 53.7 %   MCV 92.3 80 - 97 fL   MCH, POC 28.5 27 - 31.2 pg   MCHC 30.9 (A) 31.8 - 35.4 g/dL   RDW, POC 04.5 %   Platelet Count, POC 204 142 - 424 K/uL   MPV 7.0 0 - 99.8 fL      Assessment & Plan:  Jason Marsh is a 34 y.o. male Chest tightness - Plan: EKG 12-Lead, POCT SEDIMENTATION RATE, Troponin I, Ambulatory referral to Cardiology Left arm pain - Plan: EKG 12-Lead, Ambulatory referral to Cardiology Other fatigue - Plan: Basic  metabolic panel, POCT CBC Nonspecific abnormal electrocardiogram (ECG) (EKG) - Plan: POCT SEDIMENTATION RATE, Troponin I, Ambulatory referral to Cardiology  - One-year history of nonexertional rare chest tightness, with episodic left arm pain. Only minimal reproduction of upper left arm pain with Hawkins testing, no other signs of impingement, full range of motion in her neck and shoulder, and less likely MSK cause. No association of this chest tightness or  arm pain with activity, but he is not physically active with exercise or exertional activities. Abnormal EKG without previous one to compare to, discussed with cardiologist on call. There is some ST elevation but in multiple distributions, less likely acute coronary syndrome. Recommended troponin 1, and this is negative can have further follow-up with cardiology as outpatient. His office will call to schedule this appointment. I will also check a sedimentation rate to rule out pericarditis here in the office tonight. If these tests are elevated, will call patient for further evaluation emergency room. ER/91 precautions were discussed.  Prediabetes - Plan: POCT glucose (manual entry), POCT glycosylated hemoglobin (Hb A1C), Basic metabolic panel  -Still borderline/prediabetes. Work on diet/exercise once cleared by cardiology do so. Doubt that this level of hyperglycemia would cause his blurry vision. See below.  Blurry vision, bilateral - Plan: POCT glucose (manual entry), POCT glycosylated hemoglobin (Hb A1C), Basic metabolic panel, POCT CBC  -Advised to follow-up with ophthalmologist for his eye care provider for further evaluation. If they do not find the cause, return to discuss neuro or other evaluation needed. RTC/ER precautions if worsening.  Tobacco abuse - Plan: EKG 12-Lead  -Cessation discussed. When he is ready, could consider different medication such as Chantix or can refer to [facility] smoking cessation clinic for other  resources.  Essential hypertension - Plan: Basic metabolic panel  -Controlled on current dose of lisinopril 10 mg daily, refilled for 90 days, but will need to establish care with primary provider here or elsewhere for continued refills. BMP was checked, but will need fasting labs in the next 6-8 weeks for CMP and lipid panel. Understanding expressed.  9:51 PM Stat labs received: Results for orders placed or performed in visit on 12/30/14  Troponin I  Result Value Ref Range   Troponin I <0.01 <0.06 ng/mL  POCT SEDIMENTATION RATE  Result Value Ref Range   POCT SED RATE 3 0 - 22 mm/hr  Called patient, left message of normal results, and to continue follow up plan with cardiology, RTC/ER precautions.    No orders of the defined types were placed in this encounter.   Patient Instructions  You should receive a call or letter about your lab results within the next week to 10 days.  Follow up with primary provider (we can schedule you with one here if you would like) in the next 6 weeks for fasting lab testing (cholesterol, liver tests) and to recheck blood pressure and symptoms from tonight.   For your arm symptoms and chest tightness, I will check other blood tests tonight to make sure this does not look like it's from your heart or inflammation around your heart. If these are abnormal, I will call you so that you can be seen through the emergency room if needed.  You do have an abnormal EKG, so you will need to be evaluated by cardiology. They will call you to schedule that appointment, but if any increased chest tightness or any of your symptoms worsen - go to the emergency room, or call 911.   Your blood sugar is still in the range of prediabetes, so should not be contributing to the fatigue or blurry vision at this level. I would recommend he be evaluated by an ophthalmologist or eye care specialist for your blurry vision.  We can refer you if needed, or you can find one on your own. Please  let me know if you need a referral. Work on diet, and weight loss to help  with control his blood sugar. This should be rechecked by her primary care provider within the next 3 months.   Return to the clinic or go to the nearest emergency room if any of your symptoms worsen or new symptoms occur.  Hyperglycemia Hyperglycemia occurs when the glucose (sugar) in your blood is too high. Hyperglycemia can happen for many reasons, but it most often happens to people who do not know they have diabetes or are not managing their diabetes properly.  CAUSES  Whether you have diabetes or not, there are other causes of hyperglycemia. Hyperglycemia can occur when you have diabetes, but it can also occur in other situations that you might not be as aware of, such as: Diabetes  If you have diabetes and are having problems controlling your blood glucose, hyperglycemia could occur because of some of the following reasons:  Not following your meal plan.  Not taking your diabetes medications or not taking it properly.  Exercising less or doing less activity than you normally do.  Being sick. Pre-diabetes  This cannot be ignored. Before people develop Type 2 diabetes, they almost always have "pre-diabetes." This is when your blood glucose levels are higher than normal, but not yet high enough to be diagnosed as diabetes. Research has shown that some long-term damage to the body, especially the heart and circulatory system, may already be occurring during pre-diabetes. If you take action to manage your blood glucose when you have pre-diabetes, you may delay or prevent Type 2 diabetes from developing. Stress  If you have diabetes, you may be "diet" controlled or on oral medications or insulin to control your diabetes. However, you may find that your blood glucose is higher than usual in the hospital whether you have diabetes or not. This is often referred to as "stress hyperglycemia." Stress can elevate your blood  glucose. This happens because of hormones put out by the body during times of stress. If stress has been the cause of your high blood glucose, it can be followed regularly by your caregiver. That way he/she can make sure your hyperglycemia does not continue to get worse or progress to diabetes. Steroids  Steroids are medications that act on the infection fighting system (immune system) to block inflammation or infection. One side effect can be a rise in blood glucose. Most people can produce enough extra insulin to allow for this rise, but for those who cannot, steroids make blood glucose levels go even higher. It is not unusual for steroid treatments to "uncover" diabetes that is developing. It is not always possible to determine if the hyperglycemia will go away after the steroids are stopped. A special blood test called an A1c is sometimes done to determine if your blood glucose was elevated before the steroids were started. SYMPTOMS  Thirsty.  Frequent urination.  Dry mouth.  Blurred vision.  Tired or fatigue.  Weakness.  Sleepy.  Tingling in feet or leg. DIAGNOSIS  Diagnosis is made by monitoring blood glucose in one or all of the following ways:  A1c test. This is a chemical found in your blood.  Fingerstick blood glucose monitoring.  Laboratory results. TREATMENT  First, knowing the cause of the hyperglycemia is important before the hyperglycemia can be treated. Treatment may include, but is not be limited to:  Education.  Change or adjustment in medications.  Change or adjustment in meal plan.  Treatment for an illness, infection, etc.  More frequent blood glucose monitoring.  Change in exercise plan.  Decreasing  or stopping steroids.  Lifestyle changes. HOME CARE INSTRUCTIONS   Test your blood glucose as directed.  Exercise regularly. Your caregiver will give you instructions about exercise. Pre-diabetes or diabetes which comes on with stress is helped by  exercising.  Eat wholesome, balanced meals. Eat often and at regular, fixed times. Your caregiver or nutritionist will give you a meal plan to guide your sugar intake.  Being at an ideal weight is important. If needed, losing as little as 10 to 15 pounds may help improve blood glucose levels. SEEK MEDICAL CARE IF:   You have questions about medicine, activity, or diet.  You continue to have symptoms (problems such as increased thirst, urination, or weight gain). SEEK IMMEDIATE MEDICAL CARE IF:   You are vomiting or have diarrhea.  Your breath smells fruity.  You are breathing faster or slower.  You are very sleepy or incoherent.  You have numbness, tingling, or pain in your feet or hands.  You have chest pain.  Your symptoms get worse even though you have been following your caregiver's orders.  If you have any other questions or concerns. Document Released: 10/19/2000 Document Revised: 07/18/2011 Document Reviewed: 08/22/2011 [facility] Patient Information 2015 [place], [place]. This information is not intended to replace advice given to you by your health care provider. Make sure you discuss any questions you have with your health care provider. Hypertension Hypertension, commonly called high blood pressure, is when the force of blood pumping through your arteries is too strong. Your arteries are the blood vessels that carry blood from your heart throughout your body. A blood pressure reading consists of a higher number over a lower number, such as 110/72. The higher number (systolic) is the pressure inside your arteries when your heart pumps. The lower number (diastolic) is the pressure inside your arteries when your heart relaxes. Ideally you want your blood pressure below 120/80. Hypertension forces your heart to work harder to pump blood. Your arteries may become narrow or stiff. Having hypertension puts you at risk for heart disease, stroke, and other problems.  RISK FACTORS Some  risk factors for high blood pressure are controllable. Others are not.  Risk factors you cannot control include:   Race. You may be at higher risk if you are African American.  Age. Risk increases with age.  Gender. Men are at higher risk than women before age 37 years. After age 49, women are at higher risk than men. Risk factors you can control include:  Not getting enough exercise or physical activity.  Being overweight.  Getting too much fat, sugar, calories, or salt in your diet.  Drinking too much alcohol. SIGNS AND SYMPTOMS Hypertension does not usually cause signs or symptoms. Extremely high blood pressure (hypertensive crisis) may cause headache, anxiety, shortness of breath, and nosebleed. DIAGNOSIS  To check if you have hypertension, your health care provider will measure your blood pressure while you are seated, with your arm held at the level of your heart. It should be measured at least twice using the same arm. Certain conditions can cause a difference in blood pressure between your right and left arms. A blood pressure reading that is higher than normal on one occasion does not mean that you need treatment. If one blood pressure reading is high, ask your health care provider about having it checked again. TREATMENT  Treating high blood pressure includes making lifestyle changes and possibly taking medicine. Living a healthy lifestyle can help lower high blood pressure. You may need  to change some of your habits. Lifestyle changes may include:  Following the DASH diet. This diet is high in fruits, vegetables, and whole grains. It is low in salt, red meat, and added sugars.  Getting at least 2 hours of brisk physical activity every week.  Losing weight if necessary.  Not smoking.  Limiting alcoholic beverages.  Learning ways to reduce stress. If lifestyle changes are not enough to get your blood pressure under control, your health care provider may prescribe  medicine. You may need to take more than one. Work closely with your health care provider to understand the risks and benefits. HOME CARE INSTRUCTIONS  Have your blood pressure rechecked as directed by your health care provider.   Take medicines only as directed by your health care provider. Follow the directions carefully. Blood pressure medicines must be taken as prescribed. The medicine does not work as well when you skip doses. Skipping doses also puts you at risk for problems.   Do not smoke.   Monitor your blood pressure at home as directed by your health care provider. SEEK MEDICAL CARE IF:   You think you are having a reaction to medicines taken.  You have recurrent headaches or feel dizzy.  You have swelling in your ankles.  You have trouble with your vision. SEEK IMMEDIATE MEDICAL CARE IF:  You develop a severe headache or confusion.  You have unusual weakness, numbness, or feel faint.  You have severe chest or abdominal pain.  You vomit repeatedly.  You have trouble breathing. MAKE SURE YOU:   Understand these instructions.  Will watch your condition.  Will get help right away if you are not doing well or get worse. Document Released: 04/25/2005 Document Revised: 09/09/2013 Document Reviewed: 02/15/2013 [facility] Patient Information 2015 [place], [place]. This information is not intended to replace advice given to you by your health care provider. Make sure you discuss any questions you have with your health care provider.      I personally performed the services described in this documentation, which was scribed in my presence. The recorded information has been reviewed and considered, and addended by me as needed.

## 2014-12-31 ENCOUNTER — Encounter: Payer: Self-pay | Admitting: Cardiovascular Disease

## 2014-12-31 LAB — BASIC METABOLIC PANEL
BUN: 16 mg/dL (ref 7–25)
CALCIUM: 9.4 mg/dL (ref 8.6–10.3)
CO2: 25 mmol/L (ref 20–31)
CREATININE: 1.06 mg/dL (ref 0.60–1.35)
Chloride: 102 mmol/L (ref 98–110)
Glucose, Bld: 85 mg/dL (ref 65–99)
Potassium: 4.5 mmol/L (ref 3.5–5.3)
Sodium: 138 mmol/L (ref 135–146)

## 2015-01-26 ENCOUNTER — Telehealth: Payer: Self-pay | Admitting: Family Medicine

## 2015-01-26 NOTE — Telephone Encounter (Signed)
lmom to call and reschedule his appt that he had with Dr Neva Seat 02-16-15

## 2015-01-28 NOTE — Progress Notes (Signed)
This encounter was created in error - please disregard.

## 2015-01-29 ENCOUNTER — Encounter: Payer: Self-pay | Admitting: Cardiovascular Disease

## 2015-02-16 ENCOUNTER — Ambulatory Visit: Payer: Self-pay | Admitting: Family Medicine

## 2015-03-24 ENCOUNTER — Ambulatory Visit: Payer: Self-pay | Admitting: Family Medicine

## 2015-03-29 ENCOUNTER — Other Ambulatory Visit: Payer: Self-pay | Admitting: Physician Assistant

## 2015-03-30 NOTE — Telephone Encounter (Signed)
Pt is requesting a refill of lisinopril (PRINIVIL,ZESTRIL) 10 MG tablet  Pt is completely out of this medication.

## 2015-05-02 ENCOUNTER — Other Ambulatory Visit: Payer: Self-pay | Admitting: Family Medicine

## 2015-05-05 NOTE — Telephone Encounter (Signed)
[facility] called about refill on lisinopril informed Pharm. That pt has been informed that an OV is needed for additional refills//

## 2015-05-05 NOTE — Telephone Encounter (Signed)
Lm   Is patient establishing care with [facility] or another facility.   He will need an appointment for refills

## 2015-06-22 ENCOUNTER — Ambulatory Visit (INDEPENDENT_AMBULATORY_CARE_PROVIDER_SITE_OTHER): Payer: Self-pay | Admitting: Family Medicine

## 2015-06-22 VITALS — BP 132/82 | HR 88 | Temp 99.2°F | Resp 16 | Ht 70.0 in | Wt 201.0 lb

## 2015-06-22 DIAGNOSIS — J069 Acute upper respiratory infection, unspecified: Secondary | ICD-10-CM

## 2015-06-22 DIAGNOSIS — B9789 Other viral agents as the cause of diseases classified elsewhere: Principal | ICD-10-CM

## 2015-06-22 MED ORDER — AMOXICILLIN 875 MG PO TABS: 875.0000 mg | ORAL_TABLET | Freq: Two times a day (BID) | ORAL | Status: DC

## 2015-06-22 NOTE — Progress Notes (Signed)
   Subjective:    Patient ID: Jason Marsh, male    DOB: Mar 23, 1981, 35 y.o.   MRN: [id number]  HPI This is a pleasant male who presents with 5 days of sore throat, cough with some sputum that is yellow, creamy, sometimes dry. He has had muscle aches and headaches. Has had right sided constant, achy back pain with coughing. His daughter has been sick with similar symptoms. He has taken Coricidin with some relief. Sleeping well.   Past Medical History  Diagnosis Date  . Hypertension   . Diabetes mellitus without complication [facility])    Past Surgical History  Procedure Laterality Date  . Ankle surgery      left ankle   No family history on file. Social History  Substance Use Topics  . Smoking status: Former Smoker    Types: Cigarettes  . Smokeless tobacco: None  . Alcohol Use: 0.0 oz/week    0 Standard drinks or equivalent per week      Review of Systems  Constitutional: Positive for chills and fatigue. Negative for fever.  HENT: Positive for congestion and sore throat.   Respiratory: Positive for cough. Negative for shortness of breath and wheezing.   Cardiovascular: Negative for chest pain.  Musculoskeletal: Positive for myalgias.  Neurological: Positive for headaches.       Objective:   Physical Exam Physical Exam  Constitutional: Oriented to person, place, and time. He appears well-developed and well-nourished.  HENT:  Head: Normocephalic and atraumatic.  Ears: Normal canals and normal TMs Eyes: Conjunctivae are normal.  Neck: Normal range of motion. Neck supple.  Cardiovascular: Normal rate, regular rhythm and normal heart sounds.   Pulmonary/Chest: Effort normal and breath sounds normal.  Musculoskeletal: Normal range of motion.  Neurological: Alert and oriented to person, place, and time.  Skin: Skin is warm and dry.  Psychiatric: Normal mood and affect. Behavior is normal. Judgment and thought content normal.  Vitals reviewed.     BP 132/82 mmHg   Pulse 88  Temp(Src) 99.2 F (37.3 C)  Resp 16  Ht  (1.778 m)  Wt 201 lb (91.173 kg)  BMI 28.84 kg/m2  SpO2 98% Wt Readings from Last 3 Encounters:  06/22/15 201 lb (91.173 kg)  12/30/14 208 lb (94.348 kg)  03/24/10 200 lb 11.2 oz (91.037 kg)       Assessment & Plan:  1. Viral URI with cough - Provided written and verbal information regarding diagnosis and treatment. - Provided wait and see antibiotic if he is not better in 2-3 days - amoxicillin (AMOXIL) 875 MG tablet; Take 1 tablet (875 mg total) by mouth 2 (two) times daily.  Dispense: 14 tablet; Refill: 0 - RTC precautions reviewed  Olean Ree, FNP-BC  [facility], [facility]  06/24/2015 10:21 AM

## 2015-06-22 NOTE — Patient Instructions (Addendum)
Please take tylenol 2 tablets every 8 to 12 hours as needed for pain/fever/headache Please take Mucinex DM sample every 12 hours as needed for cough and to thin your mucous Please start antibiotic (amoxicillin) if you are not better in 2-3 days Can use Afrin nasal spray twice a day for 3 days maximum    Viral Infections A virus is a type of germ. Viruses can cause:  Minor sore throats.  Aches and pains.  Headaches.  Runny nose.  Rashes.  Watery eyes.  Tiredness.  Coughs.  Loss of appetite.  Feeling sick to your stomach (nausea).  Throwing up (vomiting).  Watery poop (diarrhea). HOME CARE   Only take medicines as told by your doctor.  Drink enough water and fluids to keep your pee (urine) clear or pale yellow. Sports drinks are a good choice.  Get plenty of rest and eat healthy. Soups and broths with crackers or rice are fine. GET HELP RIGHT AWAY IF:   You have a very bad headache.  You have shortness of breath.  You have chest pain or neck pain.  You have an unusual rash.  You cannot stop throwing up.  You have watery poop that does not stop.  You cannot keep fluids down.  You or your child has a temperature by mouth above 102 F (38.9 C), not controlled by medicine.  Your baby is older than 3 months with a rectal temperature of 102 F (38.9 C) or higher.  Your baby is 74 months old or younger with a rectal temperature of 100.4 F (38 C) or higher. MAKE SURE YOU:   Understand these instructions.  Will watch this condition.  Will get help right away if you are not doing well or get worse.   This information is not intended to replace advice given to you by your health care provider. Make sure you discuss any questions you have with your health care provider.   Document Released: 04/07/2008 Document Revised: 07/18/2011 Document Reviewed: 10/01/2014 Elsevier Interactive Patient Education [employer]

## 2015-08-06 ENCOUNTER — Other Ambulatory Visit: Payer: Self-pay

## 2015-08-06 NOTE — Telephone Encounter (Signed)
Patient needs refill on lisinopril (PRINIVIL,ZESTRIL) 10 MG tablet   [facility]

## 2015-08-07 MED ORDER — LISINOPRIL 10 MG PO TABS
10.0000 mg | ORAL_TABLET | Freq: Every day | ORAL | Status: DC
Start: 2015-08-07 — End: 2015-08-22

## 2015-08-07 NOTE — Telephone Encounter (Signed)
Meds ordered this encounter  Medications  . lisinopril (PRINIVIL,ZESTRIL) 10 MG tablet    Sig: Take 1 tablet (10 mg total) by mouth daily.    Dispense:  15 tablet    Refill:  0    NO MORE REFILLS WITHOUT OFFICE VISIT - 2ND NOTICE    Please advise patient that this is a two week supply, and that he needs to be seen before it runs out.,

## 2015-08-07 NOTE — Telephone Encounter (Signed)
HTN was not discussed in the OV. Please advise.

## 2015-08-07 NOTE — Telephone Encounter (Signed)
Pt called to check the status of request... States he is completely out and just forgot to ask during 2/13 OV

## 2015-08-19 NOTE — Telephone Encounter (Signed)
Pt stated that he will be in on Friday to be seen and he has enough med until then. He had an appt which we had to cancel, and now we can not re-schedule d/t not seeing new appt pts at this time. Pt verb understanding of the situation and will come to 102.

## 2015-08-22 ENCOUNTER — Ambulatory Visit (INDEPENDENT_AMBULATORY_CARE_PROVIDER_SITE_OTHER): Payer: Self-pay | Admitting: Family Medicine

## 2015-08-22 VITALS — BP 134/86 | HR 81 | Temp 98.6°F | Resp 18 | Ht 70.0 in | Wt 213.0 lb

## 2015-08-22 DIAGNOSIS — I10 Essential (primary) hypertension: Secondary | ICD-10-CM

## 2015-08-22 DIAGNOSIS — R7309 Other abnormal glucose: Secondary | ICD-10-CM

## 2015-08-22 LAB — COMPLETE METABOLIC PANEL WITH GFR
ALT: 25 U/L (ref 9–46)
AST: 17 U/L (ref 10–40)
Albumin: 4.5 g/dL (ref 3.6–5.1)
Alkaline Phosphatase: 49 U/L (ref 40–115)
BUN: 15 mg/dL (ref 7–25)
CO2: 24 mmol/L (ref 20–31)
Calcium: 8.8 mg/dL (ref 8.6–10.3)
Chloride: 105 mmol/L (ref 98–110)
Creat: 1.09 mg/dL (ref 0.60–1.35)
GFR, Est African American: >89 mL/min (ref >=60)
GFR, Est Non African American: 87 mL/min (ref >=60)
Glucose, Bld: 102 mg/dL — ABNORMAL HIGH (ref 65–99)
Potassium: 4.5 mmol/L (ref 3.5–5.3)
Sodium: 139 mmol/L (ref 135–146)
Total Bilirubin: 0.4 mg/dL (ref 0.2–1.2)
Total Protein: 7 g/dL (ref 6.1–8.1)

## 2015-08-22 LAB — POCT GLYCOSYLATED HEMOGLOBIN (HGB A1C): Hemoglobin A1C: 5.8

## 2015-08-22 MED ORDER — LISINOPRIL 10 MG PO TABS: 10.0000 mg | ORAL_TABLET | Freq: Every day | ORAL | Status: DC

## 2015-08-22 NOTE — Progress Notes (Signed)
35 yo husband and mother of 37 yo daughter.  He works in [employer].  He at one time drank heavily but has been avoiding alcohol for several years.  He has been on antihypertensives for over 10 years.  Originally he was on metoprolol bid, but was hospitalized in 2010 for a staph infection.  He was found to be borderline diabetic.  He was switched to lisinopril.  He is careful on his diet.  He still finds that he snacks late in the evening.  Objective:  BP 134/86 mmHg  Pulse 81  Temp(Src) 98.6 F (37 C) (Oral)  Resp 18  Ht 5\' 10"  (1.778 m)  Wt 213 lb (96.616 kg)  BMI 30.56 kg/m2  SpO2 98% Heart:  Reg Chest: clear Oroph:  Some plaque buildup  Elevated glucose - Plan: POCT glycosylated hemoglobin (Hb A1C), COMPLETE METABOLIC PANEL WITH GFR  Essential hypertension - Plan: POCT glycosylated hemoglobin (Hb A1C), COMPLETE METABOLIC PANEL WITH GFR, lisinopril (PRINIVIL,ZESTRIL) 10 MG tablet  Elvina SidleKurt Sammie Schermerhorn, MD

## 2015-08-22 NOTE — Patient Instructions (Addendum)
Hypertension Hypertension, commonly called high blood pressure, is when the force of blood pumping through your arteries is too strong. Your arteries are the blood vessels that carry blood from your heart throughout your body. A blood pressure reading consists of a higher number over a lower number, such as 110/72. The higher number (systolic) is the pressure inside your arteries when your heart pumps. The lower number (diastolic) is the pressure inside your arteries when your heart relaxes. Ideally you want your blood pressure below 120/80. Hypertension forces your heart to work harder to pump blood. Your arteries may become narrow or stiff. Having untreated or uncontrolled hypertension can cause heart attack, stroke, kidney disease, and other problems. RISK FACTORS Some risk factors for high blood pressure are controllable. Others are not.  Risk factors you cannot control include:   Race. You may be at higher risk if you are African American.  Age. Risk increases with age.  Gender. Men are at higher risk than women before age 45 years. After age 65, women are at higher risk than men. Risk factors you can control include:  Not getting enough exercise or physical activity.  Being overweight.  Getting too much fat, sugar, calories, or salt in your diet.  Drinking too much alcohol. SIGNS AND SYMPTOMS Hypertension does not usually cause signs or symptoms. Extremely high blood pressure (hypertensive crisis) may cause headache, anxiety, shortness of breath, and nosebleed. DIAGNOSIS To check if you have hypertension, your health care provider will measure your blood pressure while you are seated, with your arm held at the level of your heart. It should be measured at least twice using the same arm. Certain conditions can cause a difference in blood pressure between your right and left arms. A blood pressure reading that is higher than normal on one occasion does not mean that you need treatment. If  it is not clear whether you have high blood pressure, you may be asked to return on a different day to have your blood pressure checked again. Or, you may be asked to monitor your blood pressure at home for 1 or more weeks. TREATMENT Treating high blood pressure includes making lifestyle changes and possibly taking medicine. Living a healthy lifestyle can help lower high blood pressure. You may need to change some of your habits. Lifestyle changes may include:  Following the DASH diet. This diet is high in fruits, vegetables, and whole grains. It is low in salt, red meat, and added sugars.  Keep your sodium intake below 2,300 mg per day.  Getting at least 30-45 minutes of aerobic exercise at least 4 times per week.  Losing weight if necessary.  Not smoking.  Limiting alcoholic beverages.  Learning ways to reduce stress. Your health care provider may prescribe medicine if lifestyle changes are not enough to get your blood pressure under control, and if one of the following is true:  You are 18-59 years of age and your systolic blood pressure is above 140.  You are 60 years of age or older, and your systolic blood pressure is above 150.  Your diastolic blood pressure is above 90.  You have diabetes, and your systolic blood pressure is over 140 or your diastolic blood pressure is over 90.  You have kidney disease and your blood pressure is above 140/90.  You have heart disease and your blood pressure is above 140/90. Your personal target blood pressure may vary depending on your medical conditions, your age, and other factors. HOME CARE INSTRUCTIONS    Have your blood pressure rechecked as directed by your health care provider.   Take medicines only as directed by your health care provider. Follow the directions carefully. Blood pressure medicines must be taken as prescribed. The medicine does not work as well when you skip doses. Skipping doses also puts you at risk for  problems.  Do not smoke.   Monitor your blood pressure at home as directed by your health care provider. SEEK MEDICAL CARE IF:   You think you are having a reaction to medicines taken.  You have recurrent headaches or feel dizzy.  You have swelling in your ankles.  You have trouble with your vision. SEEK IMMEDIATE MEDICAL CARE IF:  You develop a severe headache or confusion.  You have unusual weakness, numbness, or feel faint.  You have severe chest or abdominal pain.  You vomit repeatedly.  You have trouble breathing. MAKE SURE YOU:   Understand these instructions.  Will watch your condition.  Will get help right away if you are not doing well or get worse.   This information is not intended to replace advice given to you by your health care provider. Make sure you discuss any questions you have with your health care provider.   Document Released: 04/25/2005 Document Revised: 09/09/2014 Document Reviewed: 02/15/2013 Elsevier Interactive Patient Education 2016 Elsevier Inc.  

## 2016-03-30 ENCOUNTER — Encounter (HOSPITAL_COMMUNITY): Payer: Self-pay | Admitting: *Deleted

## 2016-03-30 ENCOUNTER — Ambulatory Visit (HOSPITAL_COMMUNITY)
Admission: EM | Admit: 2016-03-30 | Discharge: 2016-03-30 | Disposition: A | Discharge status: Home / Self Care | Payer: BC Managed Care – PPO | Attending: Emergency Medicine | Admitting: Emergency Medicine

## 2016-03-30 DIAGNOSIS — J0140 Acute pansinusitis, unspecified: Secondary | ICD-10-CM | POA: Diagnosis not present

## 2016-03-30 MED ORDER — AMOXICILLIN-POT CLAVULANATE 875-125 MG PO TABS: 1.0000 | ORAL_TABLET | Freq: Two times a day (BID) | ORAL | 0 refills | Status: DC

## 2016-03-30 MED ORDER — BENZONATATE 100 MG PO CAPS: 100.0000 mg | ORAL_CAPSULE | Freq: Three times a day (TID) | ORAL | 0 refills | Status: DC | PRN

## 2016-03-30 NOTE — Discharge Instructions (Signed)
Start Augmentin twice a day as directed. May use Tessalon cough pills 1 every 8 hours as needed for cough. Increase fluid intake to help loosen mucus. Follow-up in 4 to 5 days if not improving.

## 2016-03-30 NOTE — ED Triage Notes (Signed)
Pt  Reports   Symptoms  Of  Cough  /   Congestion   Body  Aches   And  Fever  With  Symptoms      X  5  Days    Pt  Appears  In no  Acute  /  Severe   Distress

## 2016-03-30 NOTE — ED Provider Notes (Signed)
CSN: [id number]     Arrival date & time 03/30/16  1107 History   First MD Initiated Contact with Patient 03/30/16 1141     Chief Complaint  Patient presents with  . URI   (Consider location/radiation/quality/duration/timing/severity/associated sxs/prior Treatment) 35 year old male presents with nasal congestion, low grade fever, body aches, coughing with yellow mucus, irritated throat and headache for the past 5 to 6 days. He denies any GI symptoms. He took Dayquil at first with minimal relief. Has history of HTN- taking Lisinopril- requests medication that will not affect his blood pressure.       Past Medical History:  Diagnosis Date  . Diabetes mellitus without complication (HCC)   . Hypertension    Past Surgical History:  Procedure Laterality Date  . ANKLE SURGERY     left ankle   History reviewed. No pertinent family history. Social History  Substance Use Topics  . Smoking status: Former Smoker    Types: Cigarettes  . Smokeless tobacco: Not on file  . Alcohol use 0.0 oz/week    Review of Systems  Constitutional: Positive for fatigue and fever. Negative for appetite change and chills.  HENT: Positive for congestion, postnasal drip, sinus pain, sinus pressure and sore throat. Negative for ear pain and sneezing.   Eyes: Negative for discharge.  Respiratory: Positive for cough. Negative for chest tightness, shortness of breath and wheezing.   Cardiovascular: Negative for chest pain.  Gastrointestinal: Negative for abdominal pain, diarrhea, nausea and vomiting.  Musculoskeletal: Positive for arthralgias. Negative for back pain, myalgias, neck pain and neck stiffness.  Skin: Negative for rash.  Neurological: Positive for headaches. Negative for dizziness, weakness, light-headedness and numbness.  Hematological: Negative for adenopathy.    Allergies  Patient has no known allergies.  Home Medications   Prior to Admission medications   Medication Sig Start Date End  Date Taking? Authorizing Provider  amoxicillin-clavulanate (AUGMENTIN) 875-125 MG tablet Take 1 tablet by mouth every 12 (twelve) hours. 03/30/16   Sudie GrumblingAnn Berry Alif Petrak, NP  benzonatate (TESSALON) 100 MG capsule Take 1 capsule (100 mg total) by mouth 3 (three) times daily as needed for cough. 03/30/16   Sudie GrumblingAnn Berry Gaige Sebo, NP  lisinopril (PRINIVIL,ZESTRIL) 10 MG tablet Take 1 tablet (10 mg total) by mouth daily. 08/22/15   Elvina SidleKurt Lauenstein, MD   Meds Ordered and Administered this Visit  Medications - No data to display  BP 134/82 (BP Location: Right Arm)   Pulse 81   Temp 98.6 F (37 C) (Oral)   Resp 16   SpO2 96%  No data found.   Physical Exam  Constitutional: He is oriented to person, place, and time. He appears well-developed and well-nourished. No distress.  HENT:  Head: Normocephalic and atraumatic.  Right Ear: Hearing, tympanic membrane, external ear and ear canal normal.  Left Ear: Hearing, tympanic membrane, external ear and ear canal normal.  Nose: Mucosal edema and rhinorrhea present. Right sinus exhibits maxillary sinus tenderness and frontal sinus tenderness. Left sinus exhibits maxillary sinus tenderness and frontal sinus tenderness.  Mouth/Throat: Uvula is midline and mucous membranes are normal. Posterior oropharyngeal erythema present.  Neck: Normal range of motion. Neck supple.  Cardiovascular: Normal rate, regular rhythm, normal heart sounds and normal pulses.   Pulmonary/Chest: Effort normal. No respiratory distress. He has no decreased breath sounds. He has no wheezes. He has no rhonchi. He has no rales.  Lymphadenopathy:    He has no cervical adenopathy.  Neurological: He is alert and oriented to person,  place, and time.  Skin: Skin is warm and dry. Capillary refill takes less than 2 seconds.  Psychiatric: He has a normal mood and affect. His behavior is normal. Judgment and thought content normal.    Urgent Care Course   Clinical Course     Procedures (including  critical care time)  Labs Review Labs Reviewed - No data to display  Imaging Review No results found.   Visual Acuity Review  Right Eye Distance:   Left Eye Distance:   Bilateral Distance:    Right Eye Near:   Left Eye Near:    Bilateral Near:         MDM   1. Acute non-recurrent pansinusitis    Recommend start Augmentin 875mg  twice a day as directed. May use Tessalon cough pills 1 every 8 hours as needed for cough. Increase fluid intake to help loosen mucus. May take Tylenol as needed for fever and body aches. Follow-up in 4 to 5 days if not improving.     Sudie GrumblingAnn Berry Berkleigh Beckles, NP 03/30/16 1231

## 2017-02-02 ENCOUNTER — Ambulatory Visit (HOSPITAL_COMMUNITY)
Admission: EM | Admit: 2017-02-02 | Discharge: 2017-02-02 | Disposition: A | Discharge status: Home / Self Care | Payer: BC Managed Care – PPO | Attending: Family Medicine | Admitting: Family Medicine

## 2017-02-02 ENCOUNTER — Encounter (HOSPITAL_COMMUNITY): Payer: Self-pay | Admitting: *Deleted

## 2017-02-02 DIAGNOSIS — J069 Acute upper respiratory infection, unspecified: Secondary | ICD-10-CM

## 2017-02-02 DIAGNOSIS — B9789 Other viral agents as the cause of diseases classified elsewhere: Secondary | ICD-10-CM | POA: Diagnosis not present

## 2017-02-02 DIAGNOSIS — I10 Essential (primary) hypertension: Secondary | ICD-10-CM | POA: Diagnosis not present

## 2017-02-02 MED ORDER — IPRATROPIUM BROMIDE 0.03 % NA SOLN: 2.0000 | Freq: Two times a day (BID) | NASAL | 0 refills | Status: AC

## 2017-02-02 MED ORDER — HYDROCODONE-HOMATROPINE 5-1.5 MG/5ML PO SYRP: 5.0000 mL | ORAL_SOLUTION | Freq: Four times a day (QID) | ORAL | 0 refills | Status: DC | PRN

## 2017-02-02 MED ORDER — LISINOPRIL 10 MG PO TABS: 10.0000 mg | ORAL_TABLET | Freq: Every day | ORAL | 3 refills | Status: DC

## 2017-02-02 NOTE — ED Provider Notes (Signed)
[facility]ARE CENTER   [id number] 02/02/17 Arrival Time: 1732   SUBJECTIVE:  Jason Marsh is a 36 y.o. male who presents to the urgent care with complaint of one day of cough and nasal stuffiness.   No fever, but he does have myalgias.     Past Medical History:  Diagnosis Date  . Diabetes mellitus without complication (HCC)   . Hypertension    History reviewed. No pertinent family history. Social History   Social History  . Marital status: Married    Spouse name: N/A  . Number of children: N/A  . Years of education: N/A   Occupational History  . Not on file.   Social History Main Topics  . Smoking status: Former Smoker    Types: Cigarettes  . Smokeless tobacco: Not on file  . Alcohol use 0.0 oz/week  . Drug use: No  . Sexual activity: Not on file   Other Topics Concern  . Not on file   Social History Narrative  . No narrative on file   No outpatient prescriptions have been marked as taking for the 02/02/17 encounter [facility] Encounter).   No Known Allergies    ROS: As per HPI, remainder of ROS negative.   OBJECTIVE:   Vitals:   02/02/17 1748  BP: 138/86  Pulse: 80  Resp: 18  Temp: 99.3 F (37.4 C)  TempSrc: Oral  SpO2: 100%     General appearance: alert; no distress Eyes: PERRL; EOMI; conjunctiva normal HENT: normocephalic; atraumatic; TMs normal, canal normal, external ears normal without trauma; nasal mucosa normal; oral mucosa normal Neck: supple Lungs: faint wheeze bilaterally Heart: regular rate and rhythm Abdomen: soft, non-tender; bowel sounds normal; no masses or organomegaly; no guarding or rebound tenderness Back: no CVA tenderness Extremities: no cyanosis or edema; symmetrical with no gross deformities Skin: warm and dry Neurologic: normal gait; grossly normal Psychological: alert and cooperative; normal mood and affect      Labs:  Results for orders placed or performed in visit on 08/22/15  COMPLETE METABOLIC  PANEL WITH GFR  Result Value Ref Range   Sodium 139 135 - 146 mmol/L   Potassium 4.5 3.5 - 5.3 mmol/L   Chloride 105 98 - 110 mmol/L   CO2 24 20 - 31 mmol/L   Glucose, Bld 102 (H) 65 - 99 mg/dL   BUN 15 7 - 25 mg/dL   Creat 2.13 0.86 - 5.78 mg/dL   Total Bilirubin 0.4 0.2 - 1.2 mg/dL   Alkaline Phosphatase 49 40 - 115 U/L   AST 17 10 - 40 U/L   ALT 25 9 - 46 U/L   Total Protein 7.0 6.1 - 8.1 g/dL   Albumin 4.5 3.6 - 5.1 g/dL   Calcium 8.8 8.6 - 46.9 mg/dL   GFR, Est African American >89 >=60 mL/min   GFR, Est Non African American 87 >=60 mL/min  POCT glycosylated hemoglobin (Hb A1C)  Result Value Ref Range   Hemoglobin A1C 5.8     Labs Reviewed - No data to display  No results found.     ASSESSMENT & PLAN:  1. Viral URI with cough   2. Essential hypertension     Meds ordered this encounter  Medications  . lisinopril (PRINIVIL,ZESTRIL) 10 MG tablet    Sig: Take 1 tablet (10 mg total) by mouth daily.    Dispense:  90 tablet    Refill:  3  . HYDROcodone-homatropine (HYDROMET) 5-1.5 MG/5ML syrup    Sig: Take 5  mLs by mouth every 6 (six) hours as needed for cough.    Dispense:  60 mL    Refill:  0  . ipratropium (ATROVENT) 0.03 % nasal spray    Sig: Place 2 sprays into both nostrils 2 (two) times daily.    Dispense:  30 mL    Refill:  0    Reviewed expectations re: course of current medical issues. Questions answered. Outlined signs and symptoms indicating need for more acute intervention. Patient verbalized understanding. After Visit Summary given.    Procedures:      Elvina Sidle, MD 02/02/17 1807

## 2017-02-02 NOTE — ED Triage Notes (Signed)
Cough  Headache   stuffyness  In  Nose  Getting  Worse          Symptoms   started  [employer]

## 2021-06-25 ENCOUNTER — Emergency Department (HOSPITAL_BASED_OUTPATIENT_CLINIC_OR_DEPARTMENT_OTHER)
Admission: EM | Admit: 2021-06-25 | Discharge: 2021-06-25 | Disposition: A | Discharge status: Home / Self Care | Payer: No Typology Code available for payment source | Attending: Emergency Medicine | Admitting: Emergency Medicine

## 2021-06-25 ENCOUNTER — Other Ambulatory Visit: Payer: Self-pay

## 2021-06-25 ENCOUNTER — Emergency Department (HOSPITAL_BASED_OUTPATIENT_CLINIC_OR_DEPARTMENT_OTHER): Payer: No Typology Code available for payment source | Admitting: Radiology

## 2021-06-25 ENCOUNTER — Encounter (HOSPITAL_BASED_OUTPATIENT_CLINIC_OR_DEPARTMENT_OTHER): Payer: Self-pay

## 2021-06-25 DIAGNOSIS — M25561 Pain in right knee: Secondary | ICD-10-CM | POA: Insufficient documentation

## 2021-06-25 MED ORDER — IBUPROFEN 600 MG PO TABS: 600.0000 mg | ORAL_TABLET | Freq: Four times a day (QID) | ORAL | 0 refills | Status: AC | PRN

## 2021-06-25 MED ORDER — IBUPROFEN 400 MG PO TABS
600.0000 mg | ORAL_TABLET | Freq: Once | ORAL | Status: AC
  Administered 2021-06-25: 600 mg via ORAL
  Filled 2021-06-25: qty 1
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
## 2022-11-04 ENCOUNTER — Ambulatory Visit (HOSPITAL_COMMUNITY)
Admission: EM | Admit: 2022-11-04 | Discharge: 2022-11-04 | Disposition: A | Discharge status: Home / Self Care | Payer: BC Managed Care – PPO | Attending: Physician Assistant | Admitting: Physician Assistant

## 2022-11-04 ENCOUNTER — Encounter (HOSPITAL_COMMUNITY): Payer: Self-pay

## 2022-11-04 DIAGNOSIS — I10 Essential (primary) hypertension: Secondary | ICD-10-CM | POA: Diagnosis present

## 2022-11-04 LAB — BASIC METABOLIC PANEL
Anion gap: 9 (ref 5–15)
BUN: 14 mg/dL (ref 6–20)
CO2: 23 mmol/L (ref 22–32)
Calcium: 9.6 mg/dL (ref 8.9–10.3)
Chloride: 103 mmol/L (ref 98–111)
Creatinine, Ser: 1.02 mg/dL (ref 0.61–1.24)
GFR, Estimated: >60 mL/min (ref >60)
Glucose, Bld: 96 mg/dL (ref 70–99)
Potassium: 4.3 mmol/L (ref 3.5–5.1)
Sodium: 135 mmol/L (ref 135–145)

## 2022-11-04 MED ORDER — LISINOPRIL-HYDROCHLOROTHIAZIDE 10-12.5 MG PO TABS: 1.0000 | ORAL_TABLET | Freq: Every day | ORAL | 2 refills | Status: AC

## 2022-11-04 NOTE — ED Provider Notes (Signed)
[facility]    CSN: [id number] Arrival date & time: 11/04/22  5784      History   Chief Complaint Chief Complaint  Patient presents with   Medication Refill    HPI Jason Marsh is a 42 y.o. male.   Patient presents today for refill of blood pressure medication.  He is followed by primary care but has not been able to see them and is going to run out of his medication.  He does report compliance with this medication.  Denies any side effects or concerns.  He does not monitor his blood pressure at home.  He does try to limit salt in his diet.  He exercises occasionally but does admit that he needs to exercise more regularly.  He denies any chest pain, shortness of breath, headache, vision change, dizziness, peripheral edema.    Past Medical History:  Diagnosis Date   Diabetes mellitus without complication (HCC)    Hypertension     Patient Active Problem List   Diagnosis Date Noted   TOBACCO ABUSE 03/24/2010   HYPERTENSION, BENIGN ESSENTIAL 03/24/2010    Past Surgical History:  Procedure Laterality Date   ANKLE SURGERY     left ankle       Home Medications    Prior to Admission medications   Medication Sig Start Date End Date Taking? Authorizing Provider  lisinopril-hydrochlorothiazide (ZESTORETIC) 10-12.5 MG tablet Take 1 tablet by mouth daily. 11/04/22  Yes Enes Wegener K, PA-C  ibuprofen (ADVIL) 600 MG tablet Take 1 tablet (600 mg total) by mouth every 6 (six) hours as needed. 06/25/21   Edwin Dada P, DO  ipratropium (ATROVENT) 0.03 % nasal spray Place 2 sprays into both nostrils 2 (two) times daily. 02/02/17   Elvina Sidle, MD    Family History History reviewed. No pertinent family history.  Social History Social History   Tobacco Use   Smoking status: Former    Types: Cigarettes  Substance Use Topics   Alcohol use: Yes    Alcohol/week: 0.0 standard drinks of alcohol   Drug use: No     Allergies   Patient has no known  allergies.   Review of Systems Review of Systems  Constitutional:  Negative for activity change, appetite change, fatigue and fever.  Eyes:  Negative for visual disturbance.  Respiratory:  Negative for shortness of breath.   Cardiovascular:  Negative for chest pain, palpitations and leg swelling.  Gastrointestinal:  Negative for abdominal pain, diarrhea, nausea and vomiting.  Neurological:  Negative for dizziness, light-headedness and headaches.     Physical Exam Triage Vital Signs ED Triage Vitals [11/04/22 0946]  Enc Vitals Group     BP (!) 148/87     Pulse Rate 84     Resp 16     Temp 98.2 F (36.8 C)     Temp Source Oral     SpO2 98 %     Weight      Height      Head Circumference      Peak Flow      Pain Score      Pain Loc      Pain Edu?      Excl. in GC?    No data found.  Updated Vital Signs BP 130/86 (BP Location: Left Arm)   Pulse 84   Temp 98.2 F (36.8 C) (Oral)   Resp 16   SpO2 98%   Visual Acuity Right Eye Distance:   Left Eye  Distance:   Bilateral Distance:    Right Eye Near:   Left Eye Near:    Bilateral Near:     Physical Exam Vitals reviewed.  Constitutional:      General: He is awake.     Appearance: Normal appearance. He is well-developed. He is not ill-appearing.     Comments: Very pleasant male appears stated age in no acute distress  HENT:     Head: Normocephalic and atraumatic.     Mouth/Throat:     Pharynx: No oropharyngeal exudate, posterior oropharyngeal erythema or uvula swelling.  Cardiovascular:     Rate and Rhythm: Normal rate and regular rhythm.     Heart sounds: Normal heart sounds, S1 normal and S2 normal. No murmur heard. Pulmonary:     Effort: Pulmonary effort is normal.     Breath sounds: Normal breath sounds. No stridor. No wheezing, rhonchi or rales.     Comments: Clear to auscultation bilaterally Musculoskeletal:     Right lower leg: No edema.     Left lower leg: No edema.  Neurological:     Mental  Status: He is alert.  Psychiatric:        Behavior: Behavior is cooperative.      UC Treatments / Results  Labs (all labs ordered are listed, but only abnormal results are displayed) Labs Reviewed  BASIC METABOLIC PANEL    EKG   Radiology No results found.  Procedures Procedures (including critical care time)  Medications Ordered in UC Medications - No data to display  Initial Impression / Assessment and Plan / UC Course  I have reviewed the triage vital signs and the nursing notes.  Pertinent labs & imaging results that were available during my care of the patient were reviewed by me and considered in my medical decision making (see chart for details).     Initial blood pressure was elevated but this did improve closer to goal on recheck.  Will continue current medication regimen and refill was provided as requested by patient.  He was encouraged to monitor his diet for salt and exercise regularly.  BMP was obtained and is pending.  We will contact him if we need to alter his medication regimen based on this result.  Encouraged him to follow-up with primary care as scheduled.  Discussed that if he develops any worsening symptoms including chest pain, shortness of breath, headache, vision change, dizziness in the setting of high blood pressure he needs to be seen immediately.  All questions answered patient satisfaction.  Final Clinical Impressions(s) / UC Diagnoses   Final diagnoses:  Essential hypertension  Elevated blood pressure reading with diagnosis of hypertension     Discharge Instructions      I have refilled your medication.  I will contact you if you are blood work is abnormal and we need to change your treatment plan.  Try to avoid salt.  Exercise regularly.  Monitor your blood pressure at home and keep a log for evaluation at follow-up appointment.  If you develop any, chest pain, shortness of breath, headache, vision change, dizziness in the setting of high  blood pressure you need to be seen immediately.     ED Prescriptions     Medication Sig Dispense Auth. Provider   lisinopril-hydrochlorothiazide (ZESTORETIC) 10-12.5 MG tablet Take 1 tablet by mouth daily. 30 tablet Cortavious Nix, Noberto Retort, PA-C      PDMP not reviewed this encounter.   Jeani Hawking, PA-C 11/04/22 1034

## 2022-11-04 NOTE — Discharge Instructions (Signed)
I have refilled your medication.  I will contact you if you are blood work is abnormal and we need to change your treatment plan.  Try to avoid salt.  Exercise regularly.  Monitor your blood pressure at home and keep a log for evaluation at follow-up appointment.  If you develop any, chest pain, shortness of breath, headache, vision change, dizziness in the setting of high blood pressure you need to be seen immediately.

## 2022-11-04 NOTE — ED Triage Notes (Signed)
Pt is here for med refill for Lisinopril. Pt would like 3 refills until he can get in with his PCP.
